# Patient Record
Sex: Male | Born: 1950
Health system: Southern US, Community
[De-identification: ages and names within clinical notes are randomized; demographics above are authoritative.]

## PROBLEM LIST (undated history)

## (undated) DIAGNOSIS — I10 Essential (primary) hypertension: Secondary | ICD-10-CM

## (undated) DIAGNOSIS — K08109 Complete loss of teeth, unspecified cause, unspecified class: Secondary | ICD-10-CM

## (undated) DIAGNOSIS — M1990 Unspecified osteoarthritis, unspecified site: Secondary | ICD-10-CM

## (undated) DIAGNOSIS — B182 Chronic viral hepatitis C: Secondary | ICD-10-CM

## (undated) DIAGNOSIS — K7460 Unspecified cirrhosis of liver: Secondary | ICD-10-CM

## (undated) DIAGNOSIS — Z972 Presence of dental prosthetic device (complete) (partial): Secondary | ICD-10-CM

## (undated) DIAGNOSIS — G4730 Sleep apnea, unspecified: Secondary | ICD-10-CM

## (undated) DIAGNOSIS — Z973 Presence of spectacles and contact lenses: Secondary | ICD-10-CM

## (undated) DIAGNOSIS — T7840XA Allergy, unspecified, initial encounter: Secondary | ICD-10-CM

## (undated) DIAGNOSIS — G4733 Obstructive sleep apnea (adult) (pediatric): Secondary | ICD-10-CM

## (undated) HISTORY — PX: ESOPHAGOGASTRODUODENOSCOPY: SHX1529

## (undated) HISTORY — DX: Allergy, unspecified, initial encounter: T78.40XA

## (undated) HISTORY — PX: COLONOSCOPY: SHX174

## (undated) HISTORY — DX: Unspecified osteoarthritis, unspecified site: M19.90

## (undated) HISTORY — DX: Sleep apnea, unspecified: G47.30

## (undated) HISTORY — DX: Unspecified cirrhosis of liver: K74.60

## (undated) HISTORY — DX: Obstructive sleep apnea (adult) (pediatric): G47.33

## (undated) HISTORY — DX: Chronic viral hepatitis C: B18.2

## (undated) HISTORY — DX: Essential (primary) hypertension: I10

---

## 1997-07-24 ENCOUNTER — Ambulatory Visit: Admission: RE | Admit: 1997-07-24 | Discharge: 1997-07-24 | Payer: Self-pay | Admitting: Otolaryngology

## 1997-09-19 ENCOUNTER — Encounter: Payer: Self-pay | Admitting: Internal Medicine

## 1997-12-27 ENCOUNTER — Encounter: Payer: Self-pay | Admitting: Gastroenterology

## 1997-12-27 ENCOUNTER — Ambulatory Visit (HOSPITAL_COMMUNITY): Admission: RE | Admit: 1997-12-27 | Discharge: 1997-12-27 | Payer: Self-pay | Admitting: Gastroenterology

## 1997-12-27 ENCOUNTER — Encounter: Payer: Self-pay | Admitting: Internal Medicine

## 1998-01-10 ENCOUNTER — Encounter: Payer: Self-pay | Admitting: Internal Medicine

## 1998-10-30 ENCOUNTER — Encounter: Payer: Self-pay | Admitting: Internal Medicine

## 2000-08-26 ENCOUNTER — Encounter: Payer: Self-pay | Admitting: Internal Medicine

## 2001-08-11 ENCOUNTER — Encounter: Payer: Self-pay | Admitting: Internal Medicine

## 2003-02-11 HISTORY — PX: THYROID LOBECTOMY: SHX420

## 2003-03-31 ENCOUNTER — Other Ambulatory Visit: Admission: RE | Admit: 2003-03-31 | Discharge: 2003-03-31 | Payer: Self-pay | Admitting: Diagnostic Radiology

## 2003-04-14 ENCOUNTER — Encounter (INDEPENDENT_AMBULATORY_CARE_PROVIDER_SITE_OTHER): Payer: Self-pay | Admitting: *Deleted

## 2003-04-14 ENCOUNTER — Ambulatory Visit (HOSPITAL_COMMUNITY): Admission: RE | Admit: 2003-04-14 | Discharge: 2003-04-14 | Payer: Self-pay | Admitting: *Deleted

## 2003-11-08 ENCOUNTER — Encounter (INDEPENDENT_AMBULATORY_CARE_PROVIDER_SITE_OTHER): Payer: Self-pay | Admitting: *Deleted

## 2003-11-08 ENCOUNTER — Ambulatory Visit (HOSPITAL_COMMUNITY): Admission: RE | Admit: 2003-11-08 | Discharge: 2003-11-09 | Payer: Self-pay | Admitting: General Surgery

## 2006-04-10 ENCOUNTER — Ambulatory Visit: Payer: Self-pay | Admitting: Gastroenterology

## 2006-10-01 ENCOUNTER — Ambulatory Visit: Payer: Self-pay | Admitting: Gastroenterology

## 2007-01-27 ENCOUNTER — Ambulatory Visit (HOSPITAL_COMMUNITY): Admission: RE | Admit: 2007-01-27 | Discharge: 2007-01-27 | Payer: Self-pay | Admitting: Gastroenterology

## 2007-11-25 ENCOUNTER — Encounter: Payer: Self-pay | Admitting: Family Medicine

## 2007-11-25 ENCOUNTER — Ambulatory Visit: Payer: Self-pay | Admitting: Gastroenterology

## 2007-12-17 ENCOUNTER — Ambulatory Visit (HOSPITAL_COMMUNITY): Admission: RE | Admit: 2007-12-17 | Discharge: 2007-12-17 | Payer: Self-pay | Admitting: Gastroenterology

## 2008-01-17 ENCOUNTER — Encounter: Payer: Self-pay | Admitting: Family Medicine

## 2008-06-22 ENCOUNTER — Encounter: Payer: Self-pay | Admitting: Family Medicine

## 2008-06-22 ENCOUNTER — Ambulatory Visit: Payer: Self-pay | Admitting: Gastroenterology

## 2008-09-11 ENCOUNTER — Encounter: Payer: Self-pay | Admitting: Family Medicine

## 2008-09-11 LAB — CONVERTED CEMR LAB
ALT: 53 units/L
AST: 50 units/L

## 2008-12-11 ENCOUNTER — Encounter: Payer: Self-pay | Admitting: Family Medicine

## 2008-12-21 ENCOUNTER — Encounter (INDEPENDENT_AMBULATORY_CARE_PROVIDER_SITE_OTHER): Payer: Self-pay | Admitting: *Deleted

## 2008-12-21 ENCOUNTER — Ambulatory Visit (HOSPITAL_COMMUNITY): Admission: RE | Admit: 2008-12-21 | Discharge: 2008-12-21 | Payer: Self-pay | Admitting: Gastroenterology

## 2008-12-28 ENCOUNTER — Ambulatory Visit: Payer: Self-pay | Admitting: Gastroenterology

## 2008-12-28 ENCOUNTER — Encounter: Payer: Self-pay | Admitting: Internal Medicine

## 2009-01-22 ENCOUNTER — Encounter (INDEPENDENT_AMBULATORY_CARE_PROVIDER_SITE_OTHER): Payer: Self-pay | Admitting: *Deleted

## 2009-02-26 ENCOUNTER — Ambulatory Visit: Payer: Self-pay | Admitting: Internal Medicine

## 2009-02-26 DIAGNOSIS — K7460 Unspecified cirrhosis of liver: Secondary | ICD-10-CM

## 2009-02-26 DIAGNOSIS — B182 Chronic viral hepatitis C: Secondary | ICD-10-CM | POA: Insufficient documentation

## 2009-03-05 ENCOUNTER — Ambulatory Visit: Payer: Self-pay | Admitting: Internal Medicine

## 2009-03-07 ENCOUNTER — Encounter: Payer: Self-pay | Admitting: Internal Medicine

## 2009-04-23 ENCOUNTER — Encounter: Payer: Self-pay | Admitting: Family Medicine

## 2009-08-28 ENCOUNTER — Ambulatory Visit: Payer: Self-pay | Admitting: Family Medicine

## 2009-08-28 DIAGNOSIS — E119 Type 2 diabetes mellitus without complications: Secondary | ICD-10-CM | POA: Insufficient documentation

## 2009-08-28 DIAGNOSIS — D696 Thrombocytopenia, unspecified: Secondary | ICD-10-CM

## 2009-08-28 DIAGNOSIS — I10 Essential (primary) hypertension: Secondary | ICD-10-CM | POA: Insufficient documentation

## 2009-08-29 LAB — CONVERTED CEMR LAB
ALT: 54 units/L — ABNORMAL HIGH (ref 0–53)
AST: 42 units/L — ABNORMAL HIGH (ref 0–37)
Albumin: 4.2 g/dL (ref 3.5–5.2)
Alkaline Phosphatase: 54 units/L (ref 39–117)
BUN: 9 mg/dL (ref 6–23)
Bilirubin, Direct: 0.2 mg/dL (ref 0.0–0.3)
CO2: 33 meq/L — ABNORMAL HIGH (ref 19–32)
Calcium: 8.5 mg/dL (ref 8.4–10.5)
Chloride: 103 meq/L (ref 96–112)
Creatinine, Ser: 0.8 mg/dL (ref 0.4–1.5)
GFR calc non Af Amer: 127.44 mL/min (ref >60)
Glucose, Bld: 125 mg/dL — ABNORMAL HIGH (ref 70–99)
Hgb A1c MFr Bld: 7.3 % — ABNORMAL HIGH (ref 4.6–6.5)
Potassium: 3.8 meq/L (ref 3.5–5.1)
Sodium: 141 meq/L (ref 135–145)
Total Bilirubin: 0.6 mg/dL (ref 0.3–1.2)
Total Protein: 7.6 g/dL (ref 6.0–8.3)

## 2009-09-19 ENCOUNTER — Encounter: Payer: Self-pay | Admitting: Family Medicine

## 2009-11-27 ENCOUNTER — Ambulatory Visit: Payer: Self-pay | Admitting: Family Medicine

## 2009-11-28 LAB — CONVERTED CEMR LAB: Hgb A1c MFr Bld: 7.8 % — ABNORMAL HIGH (ref 4.6–6.5)

## 2010-02-19 ENCOUNTER — Encounter (INDEPENDENT_AMBULATORY_CARE_PROVIDER_SITE_OTHER): Payer: Self-pay | Admitting: *Deleted

## 2010-03-12 NOTE — Procedures (Signed)
Summary: Upper Endoscopy + append re: rexcent constipation  Patient: [name] Note: All result statuses are Final unless otherwise noted.  Tests: (1) Upper Endoscopy (EGD)   EGD Upper Endoscopy       DONE     [facility]     [address]     [place], [place]  [address]           ENDOSCOPY PROCEDURE REPORT           PATIENT:  Alexander [name], Alexander [name]  MR#:  [id number]     BIRTHDATE:  01/26/1951, 58 yrs. old  GENDER:  male           ENDOSCOPIST:  Iva Boop, MD, [facility]     Referred by:  Brooke Dare, M.D.           PROCEDURE DATE:  03/05/2009     PROCEDURE:  EGD with biopsy     ASA CLASS:  Class III     INDICATIONS:  cirrhosis, Evaluate for esophageal varices in a     patient with portal hypertension and/or cirrhosis.           MEDICATIONS:   Fentanyl 50 mcg IV, Versed 7 mg     TOPICAL ANESTHETIC:  Exactacain Spray           DESCRIPTION OF PROCEDURE:   After the risks benefits and     alternatives of the procedure were thoroughly explained, informed     consent was obtained.  The [facility] [id number] [id number] endoscope was     introduced through the mouth and advanced to the second portion of     the duodenum, without limitations.  The instrument was slowly     withdrawn as the mucosa was fully examined.     <<PROCEDUREIMAGES>>           The esophagus and gastroesophageal junction were completely normal     in appearance. (No varices) Abnormal appearing mucosa in the total     stomach. Red spots and friable mucosa (mild) more prominent in     antrum. Dark, semi-adherent mucoid material an antrum that was     somewhat washable. Multiple biopsies were obtained and sent to     pathology. To determine if gastritis or portal gastropathy.  The     duodenal bulb was normal in appearance, as was the postbulbar     duodenum.    Retroflexed views revealed Retroflexion exam     demonstrated findings as previously described.    The scope was     then withdrawn from the patient and the  procedure completed.           COMPLICATIONS:  None           ENDOSCOPIC IMPRESSION:     1) Normal esophagus     2) Abnormal mucosa in the total stomach - ? gastritis vs. portal     gastropathy     3) Normal duodenum     RECOMMENDATIONS:     1) await pathology results           REPEAT EXAM:  In 1 year(s) for routine EGD to rescreen for     varices. Screening  colonoscopy due  04/2013.           Iva Boop, MD, [facility]           CC:  Brooke Dare, MD - please mail a printed copy with photos     Crawford Givens,  MD [facility])     The Patient           n.     eSIGNED:   Iva Boop at 03/05/2009 10:58 AM           Henthorn, Audry Pili, [phone]  Note: An exclamation mark (!) indicates a result that was not dispersed into the flowsheet. Document Creation Date: 03/05/2009 10:58 AM _______________________________________________________________________  (1) Order result status: Final Collection or observation date-time: 03/05/2009 10:47 Requested date-time:  Receipt date-time:  Reported date-time:  Referring Physician:   Ordering Physician: Stan Head [phone]) Specimen Source:  Source: Launa Grill Order Number: [id number] Lab site:   Appended Document: Upper Endoscopy per conversation with wife he has been having indigestion and is constipated and was advised to use MiraLax by PCP clinic patient will call me back if continues with GI symptoms and will acll bx results to him also   Appended Document: Upper Endoscopy + append re: rexcent constipation     Procedures Next Due Date:    EGD: 03/2010

## 2010-03-12 NOTE — Letter (Signed)
Summary: EGD Instructions  [facility]  [address] [place], [place] [address]   Phone: [phone]  Fax: [phone]       Alexander [name]    1950/12/21    MRN: [id number]       Procedure Day /Date:MONDAY 03/05/2009     Arrival Time: 9AM     Procedure Time:10AM     Location of Procedure:                    X  [facility] (4th Floor)    PREPARATION FOR ENDOSCOPY   On 03/05/2009  THE DAY OF THE PROCEDURE:  1.   No solid foods, milk or milk products are allowed after midnight the night before your procedure.  2.   Do not drink anything colored red or purple.  Avoid juices with pulp.  No orange juice.  3.  You may drink clear liquids until 8:00AM , which is 2 hours before your procedure.                                                                                                CLEAR LIQUIDS INCLUDE: Water Jello Ice Popsicles Tea (sugar ok, no milk/cream) Powdered fruit flavored drinks Coffee (sugar ok, no milk/cream) Gatorade Juice: apple, white grape, white cranberry  Lemonade Clear bullion, consomm, broth Carbonated beverages (any kind) Strained chicken noodle soup Hard Candy   MEDICATION INSTRUCTIONS  Unless otherwise instructed, you should take regular prescription medications with a small sip of water as early as possible the morning of your procedure.  Diabetic patients - see separate instructions.             OTHER INSTRUCTIONS  You will need a responsible adult at least 60 years of age to accompany you and drive you home.   This person must remain in the waiting room during your procedure.  Wear loose fitting clothing that is easily removed.  Leave jewelry and other valuables at home.  However, you may wish to bring a book to read or an iPod/MP3 player to listen to music as you wait for your procedure to start.  Remove all body piercing jewelry and leave at home.  Total time from sign-in until discharge is  approximately 2-3 hours.  You should go home directly after your procedure and rest.  You can resume normal activities the day after your procedure.  The day of your procedure you should not:   Drive   Make legal decisions   Operate machinery   Drink alcohol   Return to work  You will receive specific instructions about eating, activities and medications before you leave.    The above instructions have been reviewed and explained to me by   _______________________    I fully understand and can verbalize these instructions _____________________________ Date _________

## 2010-03-12 NOTE — Letter (Signed)
Summary: Re-Eval/Medical [facility]ty Services  Re-Eval/Medical Specialty Services   Imported By: Sherian Rein 03/01/2009 10:27:18  _____________________________________________________________________  External Attachment:    Type:   Image     Comment:   External Document

## 2010-03-12 NOTE — Letter (Signed)
Summary: Diabetic Instructions  [facility]  [address] [place], [place] [address]   Phone: [phone]  Fax: [phone]    Alexander [name] [birth year] MRN: [id number]   X   ORAL DIABETIC MEDICATION INSTRUCTIONS  (METFORMIN)  The day before your procedure:   Take your diabetic pill as you do normally  The day of your procedure:   Do not take your diabetic pill    We will check your blood sugar levels during the admission process and again in Recovery before discharging you home  ________________________________________________________________________  _  _   INSULIN (LONG ACTING) MEDICATION INSTRUCTIONS (Lantus, NPH, 70/30, Humulin, Novolin-N)   The day before your procedure:   Take  your regular evening dose    The day of your procedure:   Do not take your morning dose    _  _   INSULIN (SHORT ACTING) MEDICATION INSTRUCTIONS (Regular, Humulog, Novolog)   The day before your procedure:   Do not take your evening dose   The day of your procedure:   Do not take your morning dose   _  _   INSULIN PUMP MEDICATION INSTRUCTIONS  We will contact the physician managing your diabetic care for written dosage instructions for the day before your procedure and the day of your procedure.  Once we have received the instructions, we will contact you.

## 2010-03-12 NOTE — Assessment & Plan Note (Signed)
Summary: PT TO TRANSFER FROM [facility]   Vital Signs:  Patient profile:   60 year old male Height:      75 inches Weight:      264.25 pounds BMI:     33.15 Temp:     97.5 degrees F oral Pulse rate:   88 / minute Pulse rhythm:   regular BP sitting:   144 / 90  (left arm) Cuff size:   large  Vitals Entered By: Delilah Shan CMA Jameia Makris Dull) (August 28, 2009 10:52 AM) CC: Transfer from [facility]   History of Present Illness: Colonoscopy was done at 50.  EGD was done in 1/11, plan to recheck 02/2010.    Diabetes: Needs lancets and strips with 90d rx.   Using medications without difficulties: yes except for gas on acarbose Hypoglycemic episodes: no Hyperglycemic episodes:no Feet problems:no Blood Sugars averaging:  ~130-155 in AM, occ lower with exercise in AMs eye exam within last year: Sees eye MD 2 months.   due for A1c today.  Hypertension:      Using medication without problems or lightheadedness: yes Chest pain with exertion:no Edema: occ if prolong standing Short of breath:no Average home BPs: 125-130s/80s Other issues: no  Followed  by Dr. Jacqualine Mau with medical specialty clinic for HCV.    Current Medications (verified): 1)  Metformin Hcl 1000 Mg Tabs (Metformin Hcl) .... Take 1 Tablet By Mouth Two Times A Day 2)  Acarbose 25 Mg Tabs (Acarbose) .... Take 2 Tablets Three Times A Day 3)  Lisinopril 40 Mg Tabs (Lisinopril) .... Take 1 Tablet By Mouth Once A Day 4)  Metoprolol Succinate 100 Mg Xr24h-Tab (Metoprolol Succinate) .... Take 1-1/2  Tablets  By Mouth Once A Day 5)  Multivitamins  Tabs (Multiple Vitamin) .... Take 1 Tablet By Mouth Once A Day 6)  Diovan Hct 320-25 Mg Tabs (Valsartan-Hydrochlorothiazide) .... Take 1 Tablet By Mouth Once A Day 7)  Tums Ultra 1000 1000 Mg Chew (Calcium Carbonate Antacid) .... Chew One Daily 8)  [facility] Aspirin 81 Mg Tbec (Aspirin) .... Take 1 Tablet By Mouth Once A Day 9)  Amlodipine Besylate 10 Mg Tabs (Amlodipine Besylate) .... Take 1  Tablet By Mouth Once Daily 10)  Aspirin 81 Mg  Tabs (Aspirin) .... Take 1 Tablet By Mouth Once A Day  Allergies: No Known Drug Allergies  Past History:  Past Medical History: TB SKIN TEST, POSITIVE (ICD-795.5) HEPATITIS C, CHRONIC (ICD-070.54) s/p HBV and HAV vaccines, genotype 1, nonresponder to IFN/ribavirin HEPATIC CIRRHOSIS (ICD-571.5) SCREENING, COLON CANCER (ICD-V76.51) Colonoscopy was done at 50.  EGD was done in 1/11, plan to recheck 02/2010.   Diabetes mellitus, type II Hypertension  Past Surgical History: Rt Thyroid Lobectomy - 2005  Social History: Reviewed history from 02/26/2009 and no changes required. Occupation: Retired Married 3 childern Patient is a former smoker.  Alcohol Use - no Daily Caffeine Use: one daily  Illicit Drug Use - no Patient does not get regular exercise.  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    Review of Systems       See HPI.  Otherwise noncontributory.    Physical Exam  General:  GEN: nad, alert and oriented HEENT: mucous membranes moist NECK: supple w/o LA CV: rrr.  no murmur PULM: ctab, no inc wob ABD: soft, +bs EXT: no edema SKIN: no acute rash   Diabetes Management Exam:    Foot Exam (with socks and/or shoes not present):       Sensory-Pinprick/Light touch:  Left medial foot (L-4): normal          Left dorsal foot (L-5): normal          Left lateral foot (S-1): normal          Right medial foot (L-4): normal          Right dorsal foot (L-5): normal          Right lateral foot (S-1): normal       Sensory-Monofilament:          Left foot: normal          Right foot: normal       Inspection:          Left foot: normal          Right foot: normal       Nails:          Left foot: normal          Right foot: normal    Eye Exam:       Eye Exam done elsewhere   Impression & Recommendations:  Problem # 1:  HYPERTENSION, BENIGN (ICD-401.1) No change in meds.  Contact with labs.  The following medications  were removed from the medication list:    Amlodipine Besylate 5 Mg Tabs (Amlodipine besylate) .Marland Kitchen... Take 1 tablet by mouth two times a day His updated medication list for this problem includes:    Lisinopril 40 Mg Tabs (Lisinopril) .Marland Kitchen... Take 1 tablet by mouth once a day    Metoprolol Succinate 100 Mg Xr24h-tab (Metoprolol succinate) .Marland Kitchen... Take 1-1/2  tablets  by mouth once a day    Diovan Hct 320-25 Mg Tabs (Valsartan-hydrochlorothiazide) .Marland Kitchen... Take 1 tablet by mouth once a day    Amlodipine Besylate 10 Mg Tabs (Amlodipine besylate) .Marland Kitchen... Take 1 tablet by mouth once daily  Problem # 2:  DIABETES MELLITUS (ICD-250.00) Contact with labs.  Increase exercise.  Plan for follow up in 3 months with physical within next  ~6 months.  His updated medication list for this problem includes:    Metformin Hcl 1000 Mg Tabs (Metformin hcl) .Marland Kitchen... Take 1 tablet by mouth two times a day    Acarbose 25 Mg Tabs (Acarbose) .Marland Kitchen... Take 2 tablets three times a day    Lisinopril 40 Mg Tabs (Lisinopril) .Marland Kitchen... Take 1 tablet by mouth once a day    Diovan Hct 320-25 Mg Tabs (Valsartan-hydrochlorothiazide) .Marland Kitchen... Take 1 tablet by mouth once a day    [facility] Joseph Aspirin 81 Mg Tbec (Aspirin) .Marland Kitchen... Take 1 tablet by mouth once a day    Aspirin 81 Mg Tabs (Aspirin) .Marland Kitchen... Take 1 tablet by mouth once a day  Orders: TLB-A1C / Hgb A1C (Glycohemoglobin) (83036-A1C) TLB-Hepatic/Liver Function Pnl (80076-HEPATIC) TLB-BMP (Basic Metabolic Panel-BMET) (80048-METABOL)  Complete Medication List: 1)  Metformin Hcl 1000 Mg Tabs (Metformin hcl) .... Take 1 tablet by mouth two times a day 2)  Acarbose 25 Mg Tabs (Acarbose) .... Take 2 tablets three times a day 3)  Lisinopril 40 Mg Tabs (Lisinopril) .... Take 1 tablet by mouth once a day 4)  Metoprolol Succinate 100 Mg Xr24h-tab (Metoprolol succinate) .... Take 1-1/2  tablets  by mouth once a day 5)  Multivitamins Tabs (Multiple vitamin) .... Take 1 tablet by mouth once a day 6)  Diovan Hct  320-25 Mg Tabs (Valsartan-hydrochlorothiazide) .... Take 1 tablet by mouth once a day 7)  Tums Ultra 1000 1000 Mg Chew (Calcium carbonate antacid) .... Chew one  daily 8)  [facility] Joseph Aspirin 81 Mg Tbec (Aspirin) .... Take 1 tablet by mouth once a day 9)  Amlodipine Besylate 10 Mg Tabs (Amlodipine besylate) .... Take 1 tablet by mouth once daily 10)  Aspirin 81 Mg Tabs (Aspirin) .... Take 1 tablet by mouth once a day  Patient Instructions: 1)  Please schedule a follow-up appointment in 3 months.  We'll contact you with your lab report. Take care.  Try to increase your walking and exercise.   Current Allergies (reviewed today): No known allergies

## 2010-03-12 NOTE — Letter (Signed)
Summary: Medical Specialty Services  Medical Specialty Services   Imported By: Lanelle Bal 09/21/2009 09:29:27  _____________________________________________________________________  External Attachment:    Type:   Image     Comment:   External Document

## 2010-03-12 NOTE — Assessment & Plan Note (Signed)
Summary: SCREENING EGD/VARICES...AS.   History of Present Illness Visit Type: consult  Primary GI MD: Stan Head MD [facility] Primary Provider: Crawford Givens, MD  Requesting Provider: Brooke Dare, MD Chief Complaint: cirrhosis History of Present Illness:   no problems has HCV and cirrhosis and is being seen to schedule EGD to screen for esophageal varices   GI Review of Systems      Denies abdominal pain, acid reflux, belching, bloating, chest pain, dysphagia with liquids, dysphagia with solids, heartburn, loss of appetite, nausea, vomiting, vomiting blood, weight loss, and  weight gain.      Reports liver problems.     Denies anal fissure, black tarry stools, change in bowel habit, constipation, diarrhea, diverticulosis, fecal incontinence, heme positive stool, hemorrhoids, irritable bowel syndrome, jaundice, light color stool, rectal bleeding, and  rectal pain.    Current Medications (verified): 1)  Metformin Hcl 1000 Mg Tabs (Metformin Hcl) .... Take 1 Tablet By Mouth Two Times A Day 2)  Acarbose 25 Mg Tabs (Acarbose) .... Take 2 Tablets Three Times A Day 3)  Lisinopril 40 Mg Tabs (Lisinopril) .... Take 1 Tablet By Mouth Once A Day 4)  Metoprolol Succinate 100 Mg Xr24h-Tab (Metoprolol Succinate) .... Take 1 Tablet By Mouth Once A Day 5)  Amlodipine Besylate 5 Mg Tabs (Amlodipine Besylate) .... Take 1 Tablet By Mouth Two Times A Day 6)  Multivitamins  Tabs (Multiple Vitamin) .... Take 1 Tablet By Mouth Once A Day 7)  Diovan Hct 320-25 Mg Tabs (Valsartan-Hydrochlorothiazide) .... Take 1 Tablet By Mouth Once A Day 8)  Tums Ultra 1000 1000 Mg Chew (Calcium Carbonate Antacid) .... Chew One Daily 9)  [facility] Aspirin 81 Mg Tbec (Aspirin) .... Take 1 Tablet By Mouth Once A Day  Allergies (verified): No Known Drug Allergies  Past History:  Past Medical History: Thrombocytopenia - mild Cirrhosis Diabetes Hepatitis C- genotype 1, nonresponder to  IFN/ribavirin Hypertension Obesity s/p HBV and HAV vaccines  Past Surgical History: Reviewed history from 02/22/2009 and no changes required. Rt Thyroid Lobectomy  Family History: No FH of Colon Cancer: Family History of Stomach Cancer:Father  Family History of Diabetes: Maternal Side   Social History: Occupation: Retired Married 3 childern Patient is a former smoker.  Alcohol Use - no Daily Caffeine Use: one daily  Illicit Drug Use - no Patient does not get regular exercise.  Smoking Status:  quit Drug Use:  no Does Patient Exercise:  no  Review of Systems       The patient complains of allergy/sinus and sore throat.         All other ROS negative except as per HPI.    Vital Signs:  Patient profile:   60 year old male Height:      72 inches Weight:      262 pounds BMI:     35.66 BSA:     2.39 Pulse rate:   76 / minute Pulse rhythm:   regular BP sitting:   132 / 84  (left arm) Cuff size:   regular  Vitals Entered By: Ok Anis CMA (February 26, 2009 10:54 AM)  Physical Exam  General:  overweight NAD Eyes:  anicteric Mouth:  No deformity or lesions, dentition normal. Neck:  Supple; no masses or thyromegaly. Lungs:  Clear throughout to auscultation. Heart:  Regular rate and rhythm; no murmurs, rubs,  or bruits. Abdomen:  soft, non-tender without HSM or mass Extremities:  no lower edema Neurologic:  Alert and  oriented x3 Psych:  Alert and cooperative. Normal mood and affect.   Impression & Recommendations:  Problem # 1:  HEPATIC CIRRHOSIS (ICD-571.5) Assessment Comment Only New to me. Risks, benefits,and indications of endoscopic procedure(s) were reviewed with the patient and all questions answered. EGD to screen for varices will cc: PCP and [facility] when done  Orders: EGD (EGD)  Problem # 2:  HEPATITIS C, CHRONIC (ICD-070.54) Assessment: Unchanged genotype 1 s/p IFN ribavirin - did not respond IL28 polymorphism genotype CT  Problem # 3:   SCREENING, COLON CANCER (ICD-V76.51) had colonoscopy 2005 - normal [facility] ([place]) would be due 2015  Patient Instructions: 1)  Your EGD is scheduled for 03/05/2009 at 10am 2)  We have given you diabetic instructions 3)  Upper Endoscopy brochure given.  4)  The medication list was reviewed and reconciled.  All changed / newly prescribed medications were explained.  A complete medication list was provided to the patient / caregiver. cc: [facility]

## 2010-03-12 NOTE — Assessment & Plan Note (Signed)
Summary: FOLLOW UP / LFW   Vital Signs:  Patient profile:   60 year old male Height:      75 inches Weight:      266.25 pounds BMI:     33.40 Temp:     97.7 degrees F oral Pulse rate:   88 / minute Pulse rhythm:   regular BP sitting:   142 / 110  (left arm) Cuff size:   large  Vitals Entered By: Delilah Shan CMA Duncan Dull) (November 27, 2009 8:13 AM) CC: 3 months follow up   History of Present Illness: Flu shot done at work.    Diabetes: running higher than before Using medications without difficulties:yes, but some gas with acarbose (sometimes cuting back to bid instead of tid). Hypoglycemic episodes:no Hyperglycemic episodes:no Feet problems:no Blood Sugars averaging: 140-160 in AM, depending on PM foods the night before eye exam within last year: pending per patient- he is going to work on this.  Call at the hospital has changed his schedule.   Exercise:  Walking some at work- we have discussed this before.  He is trying to exercise more with his wife at home.  Use the stairs more at work.    Due to see medical specialty clinic in 11/11 and repeat EGD in 1/12.    Allergies: No Known Drug Allergies  Review of Systems       See HPI.  Otherwise negative.    Physical Exam  General:  GEN: nad, alert and oriented HEENT: mucous membranes moist NECK: supple w/o LA CV: rrr.  no murmur PULM: ctab, no inc wob ABD: soft, +bs EXT: no edema SKIN: no acute rash   Diabetes Management Exam:    Foot Exam (with socks and/or shoes not present):       Sensory-Pinprick/Light touch:          Left medial foot (L-4): normal          Left dorsal foot (L-5): normal          Left lateral foot (S-1): normal          Right medial foot (L-4): normal          Right dorsal foot (L-5): normal          Right lateral foot (S-1): normal       Sensory-Monofilament:          Left foot: normal          Right foot: normal       Inspection:          Left foot: normal          Right foot:  normal       Nails:          Left foot: normal          Right foot: normal   Impression & Recommendations:  Problem # 1:  DIABETES MELLITUS (ICD-250.00) No change in meds for now.  See notes on A1c result.  Inc exercise in meantime.  Plan on CPE in early 2012. His updated medication list for this problem includes:    Metformin Hcl 1000 Mg Tabs (Metformin hcl) .Marland Kitchen... Take 1 tablet by mouth two times a day    Acarbose 25 Mg Tabs (Acarbose) .Marland Kitchen... Take 2 tablets three times a day    Lisinopril 40 Mg Tabs (Lisinopril) .Marland Kitchen... Take 1 tablet by mouth once a day    Diovan Hct 320-25 Mg Tabs (Valsartan-hydrochlorothiazide) .Marland Kitchen... Take 1 tablet by  mouth once a day    [facility] Aspirin 81 Mg Tbec (Aspirin) .Marland Kitchen... Take 1 tablet by mouth once a day    Aspirin 81 Mg Tabs (Aspirin) .Marland Kitchen... Take 1 tablet by mouth once a day  His updated medication list for this problem includes:    Metformin Hcl 1000 Mg Tabs (Metformin hcl) .Marland Kitchen... Take 1 tablet by mouth two times a day    Acarbose 25 Mg Tabs (Acarbose) .Marland Kitchen... Take 2 tablets three times a day    Lisinopril 40 Mg Tabs (Lisinopril) .Marland Kitchen... Take 1 tablet by mouth once a day    Diovan Hct 320-25 Mg Tabs (Valsartan-hydrochlorothiazide) .Marland Kitchen... Take 1 tablet by mouth once a day    [facility] Aspirin 81 Mg Tbec (Aspirin) .Marland Kitchen... Take 1 tablet by mouth once a day    Aspirin 81 Mg Tabs (Aspirin) .Marland Kitchen... Take 1 tablet by mouth once a day  Complete Medication List: 1)  Metformin Hcl 1000 Mg Tabs (Metformin hcl) .... Take 1 tablet by mouth two times a day 2)  Acarbose 25 Mg Tabs (Acarbose) .... Take 2 tablets three times a day 3)  Lisinopril 40 Mg Tabs (Lisinopril) .... Take 1 tablet by mouth once a day 4)  Metoprolol Succinate 100 Mg Xr24h-tab (Metoprolol succinate) .... Take 1-1/2  tablets  by mouth once a day 5)  Multivitamins Tabs (Multiple vitamin) .... Take 1 tablet by mouth once a day 6)  Diovan Hct 320-25 Mg Tabs (Valsartan-hydrochlorothiazide) .... Take 1 tablet by mouth  once a day 7)  Tums Ultra 1000 1000 Mg Chew (Calcium carbonate antacid) .... Chew one daily 8)  [facility] Aspirin 81 Mg Tbec (Aspirin) .... Take 1 tablet by mouth once a day 9)  Amlodipine Besylate 10 Mg Tabs (Amlodipine besylate) .... Take 1 tablet by mouth once daily 10)  Aspirin 81 Mg Tabs (Aspirin) .... Take 1 tablet by mouth once a day  Other Orders: TLB-A1C / Hgb A1C (Glycohemoglobin) (83036-A1C)  Patient Instructions: 1)  You can get your results through our phone system.  Follow the instructions on the blue card.  2)  I would get a physical in 2/12.  I would get your fasting labs done ahead of time. 3)  cmet/lipid/A1c 250.00 4)  PSA 4.76.44 5)  Take care.  Glad to see you.  Try to walk more at home.    Orders Added: 1)  Est. Patient Level III [[address]] 2)  TLB-A1C / Hgb A1C (Glycohemoglobin) [83036-A1C]   Immunization History:  Influenza Immunization History:    Influenza:  historical (11/19/2009)   Immunization History:  Influenza Immunization History:    Influenza:  Historical (11/19/2009)  Current Allergies (reviewed today): No known allergies

## 2010-03-12 NOTE — Procedures (Signed)
Summary: Colonoscopy: Dr. Luther Parody: Normal   Colonoscopy  Procedure date:  04/14/2003  Findings:      Results: Normal. Location:  [facility].    Procedures Next Due Date:    Colonoscopy: 04/2013  Patient Name: Alexander [name], Alexander [name] MRN: [id number] Procedure Procedures: Colonoscopy CPT: 54098.  Personnel: Endoscopist: Roosvelt Harps, MD.  Referred By: Henrine Screws, MD.  Exam Location: Exam performed in Endoscopy Suite. Outpatient  Patient Consent: Procedure, Alternatives, Risks and Benefits discussed, consent obtained, from patient. Consent was obtained by the RN. Consent to be contacted was not given.  Indications  Average Risk Screening Routine.  History  Current Medications: Patient is not currently taking Coumadin.  Allergies: No known allergies.  Patient Habits Patient does not smoke. Drinking Status: not currently drinking.  Pre-Exam Physical: Cardio-pulmonary exam, Rectal exam, HEENT exam , Abdominal exam, Extremity exam, Neurological exam, Mental status exam WNL.  Exam Exam: Extent of exam reached: Cecum, extent intended: Cecum.  The cecum was identified by appendiceal orifice and IC valve. Patient position: on left side. Colon retroflexion performed. Images taken. ASA Classification: II. Tolerance: excellent.  Monitoring: Pulse and BP monitoring, Oximetry used. Supplemental O2 given.  Colon Prep Used Phospho Soda for colon prep. Prep results: excellent.  Fluoroscopy: Fluoroscopy was not used.  Sedation Meds: Patient assessed and found to be appropriate for moderate (conscious) sedation. Sedation was managed by the Endoscopist. Demerol 100 mg. given IV. Versed 9 mg. given IV.  Findings IMAGE TAKEN: Ascending Colon.  Image #2 attached.  Comments:  Normal.  IMAGE TAKEN: Cecum.  Image #1 attached.  Comments:  Normal.  IMAGE TAKEN: Sigmoid Colon.  Image #3 attached.  Comments:  Normal.  IMAGE TAKEN: Rectum.  Image #4 attached.  Comments:  Normal.    Assessment Normal examination.  Events  Unplanned Interventions: No intervention was required.  Unplanned Events: There were no complications. Plans  Post Exam Instructions: Post sedation instructions given.  Medication Plan: Continue current medications.  Patient Education: Patient given standard instructions for: Patient instructed to get routine colonoscopy every 10 years.  Disposition: After procedure patient sent to recovery. After recovery patient sent home.  Scheduling/Referral: Colonoscopy, to Roosvelt Harps, MD, around Apr 13, 2013.   CC:   Henrine Screws, MD  This report was created from the original endoscopy report, which was reviewed and signed by the above listed endoscopist.

## 2010-03-12 NOTE — Letter (Signed)
Summary: Medical Specialty Services  Medical Specialty Services   Imported By: Lanelle Bal 09/21/2009 09:26:23  _____________________________________________________________________  External Attachment:    Type:   Image     Comment:   External Document

## 2010-03-12 NOTE — Letter (Signed)
Summary: Patient [name] Biopsy Results  [facility]  [address] [place], [place] [address]   Phone: [phone]  Fax: [phone]        March 07, 2009 MRN: [id number]    [facility] [name] [name], [place]  [address]    Dear Alexander [name],  I am pleased to inform you that the biopsies taken during your recent endoscopic examination did not show any abnormality. The appearance of the stomach mucosa or lining suggested possible inflammation or changes from your liver disease but the biopsies did not reveal any abnormality. This is good news.  You should have a repeat endoscopic examination to look for the development of esophageal varices in 1 year.  Please call us if you are having persistent problems or have questions about your condition that have not been fully answered at this time.  Sincerely,  Iva Boop MD, [facility]  This letter has been electronically signed by your physician.  Appended Document: Patient Notice-Endo Biopsy Results Letter mailed 1.27.11

## 2010-03-13 ENCOUNTER — Encounter (INDEPENDENT_AMBULATORY_CARE_PROVIDER_SITE_OTHER): Payer: Self-pay | Admitting: *Deleted

## 2010-03-14 ENCOUNTER — Ambulatory Visit: Admit: 2010-03-14 | Payer: Self-pay | Admitting: Internal Medicine

## 2010-03-14 ENCOUNTER — Encounter: Payer: Self-pay | Admitting: Internal Medicine

## 2010-03-14 NOTE — Letter (Signed)
Summary: Pre Visit Letter Revised  [facility]  [address] [place], [place] [address]   Phone: [phone]  Fax: [phone]        02/19/2010 MRN: [id number] [facility] [address] [place], [place]  [address]             Procedure Date:  03-27-10   Welcome to the Gastroenterology Division at [facility]    You are scheduled to see a nurse for your pre-procedure visit on 03-14-10 at 2:00p.m. on the 3rd floor at [facility], [address]  We ask that you try to arrive at our office 15 minutes prior to your appointment time to allow for check-in.  Please take a minute to review the attached form.  If you answer "Yes" to one or more of the questions on the first page, we ask that you call the person listed at your earliest opportunity.  If you answer "No" to all of the questions, please complete the rest of the form and bring it to your appointment.    Your nurse visit will consist of discussing your medical and surgical history, your immediate family medical history, and your medications.   If you are unable to list all of your medications on the form, please bring the medication bottles to your appointment and we will list them.  We will need to be aware of both prescribed and over the counter drugs.  We will need to know exact dosage information as well.    Please be prepared to read and sign documents such as consent forms, a financial agreement, and acknowledgement forms.  If necessary, and with your consent, a friend or relative is welcome to sit-in on the nurse visit with you.  Please bring your insurance card so that we may make a copy of it.  If your insurance requires a referral to see a specialist, please bring your referral form from your primary care physician.  No co-pay is required for this nurse visit.     If you cannot keep your appointment, please call [phone] to cancel or reschedule prior to your appointment date.  This allows [place] the  opportunity to schedule an appointment for another patient in need of care.    Thank you for choosing [facility] for your medical needs.  We appreciate the opportunity to care for you.  Please visit [place] at our website  to learn more about our practice.  Sincerely, The Gastroenterology Division

## 2010-03-15 ENCOUNTER — Encounter: Payer: Self-pay | Admitting: Internal Medicine

## 2010-03-20 NOTE — Miscellaneous (Signed)
Summary: [facility] previsit  Clinical Lists Changes  Observations: Added new observation of NKA: T (03/14/2010 14:19)

## 2010-03-20 NOTE — Letter (Signed)
Summary: EGD Instructions  [name]  [address] [place], [place] [address]   Phone: [phone]  Fax: [phone]       Alexander [name]    1950/12/31    MRN: [id number]       Procedure Day [name]:  Wednesday 03/27/2010     Arrival Time: 9:00 am     Procedure Time: 10:00 am     Location of Procedure:                    _x  _  Endoscopy Center (4th Floor)    PREPARATION FOR ENDOSCOPY   On Wednesday 2/15 THE DAY OF THE PROCEDURE:  1.   No solid foods, milk or milk products are allowed after midnight the night before your procedure.  2.   Do not drink anything colored red or purple.  Avoid juices with pulp.  No orange juice.  3.  You may drink clear liquids until 8:00 am, which is 2 hours before your procedure.                                                                                                CLEAR LIQUIDS INCLUDE: Water Jello Ice Popsicles Tea (sugar ok, no milk/cream) Powdered fruit flavored drinks Coffee (sugar ok, no milk/cream) Gatorade Juice: apple, white grape, white cranberry  Lemonade Clear bullion, consomm, broth Carbonated beverages (any kind) Strained chicken noodle soup Hard Candy   MEDICATION INSTRUCTIONS  Unless otherwise instructed, you should take regular prescription medications with a small sip of water as early as possible the morning of your procedure.  Diabetic patients - see separate instructions.             OTHER INSTRUCTIONS  You will need a responsible adult at least 60 years of age to accompany you and drive you home.   This person must remain in the waiting room during your procedure.  Wear loose fitting clothing that is easily removed.  Leave jewelry and other valuables at home.  However, you may wish to bring a book to read or an iPod/MP3 player to listen to music as you wait for your procedure to start.  Remove all body piercing jewelry and leave at home.  Total time from sign-in  until discharge is approximately 2-3 hours.  You should go home directly after your procedure and rest.  You can resume normal activities the day after your procedure.  The day of your procedure you should not:   Drive   Make legal decisions   Operate machinery   Drink alcohol   Return to work  You will receive specific instructions about eating, activities and medications before you leave.    The above instructions have been reviewed and explained to me by   Karl Bales RN  March 14, 2010 2:19 PM    I fully understand and can verbalize these instructions _____________________________ Date _________

## 2010-03-20 NOTE — Letter (Signed)
Summary: Diabetic Instructions  [facility]  [address] [place], [place] [address]   Phone: [phone]  Fax: [phone]    Alexander [name] 09-Feb-1951 MRN: [id number]   x    ORAL DIABETIC MEDICATION INSTRUCTIONS  The day before your procedure:   Take your diabetic pill as you do normally  The day of your procedure:   Do not take your diabetic pill    We will check your blood sugar levels during the admission process and again in Recovery before discharging you home  ________________________________________________________________________

## 2010-03-26 ENCOUNTER — Encounter (INDEPENDENT_AMBULATORY_CARE_PROVIDER_SITE_OTHER): Payer: Self-pay | Admitting: *Deleted

## 2010-03-26 ENCOUNTER — Other Ambulatory Visit (INDEPENDENT_AMBULATORY_CARE_PROVIDER_SITE_OTHER): Payer: BC Managed Care – PPO

## 2010-03-26 ENCOUNTER — Other Ambulatory Visit: Payer: Self-pay | Admitting: Family Medicine

## 2010-03-26 DIAGNOSIS — I10 Essential (primary) hypertension: Secondary | ICD-10-CM

## 2010-03-26 DIAGNOSIS — E119 Type 2 diabetes mellitus without complications: Secondary | ICD-10-CM

## 2010-03-26 DIAGNOSIS — Z125 Encounter for screening for malignant neoplasm of prostate: Secondary | ICD-10-CM

## 2010-03-26 LAB — BASIC METABOLIC PANEL
BUN: 8 mg/dL (ref 6–23)
Calcium: 8.9 mg/dL (ref 8.4–10.5)
GFR: 115.46 mL/min (ref >60.00)
Glucose, Bld: 177 mg/dL — ABNORMAL HIGH (ref 70–99)
Potassium: 3.8 mEq/L (ref 3.5–5.1)

## 2010-03-26 LAB — LIPID PANEL
Cholesterol: 174 mg/dL (ref 0–200)
Total CHOL/HDL Ratio: 6
Triglycerides: 104 mg/dL (ref 0.0–149.0)

## 2010-03-26 LAB — HEPATIC FUNCTION PANEL
AST: 44 U/L — ABNORMAL HIGH (ref 0–37)
Albumin: 3.8 g/dL (ref 3.5–5.2)

## 2010-03-26 LAB — HEMOGLOBIN A1C: Hgb A1c MFr Bld: 8 % — ABNORMAL HIGH (ref 4.6–6.5)

## 2010-03-27 ENCOUNTER — Other Ambulatory Visit: Payer: Self-pay | Admitting: Internal Medicine

## 2010-03-27 ENCOUNTER — Other Ambulatory Visit (AMBULATORY_SURGERY_CENTER): Payer: BC Managed Care – PPO | Admitting: Internal Medicine

## 2010-03-27 DIAGNOSIS — K7460 Unspecified cirrhosis of liver: Secondary | ICD-10-CM

## 2010-03-27 DIAGNOSIS — K2970 Gastritis, unspecified, without bleeding: Secondary | ICD-10-CM

## 2010-03-27 LAB — GLUCOSE, CAPILLARY: Glucose-Capillary: 169 mg/dL — ABNORMAL HIGH (ref 70–99)

## 2010-03-28 NOTE — Letter (Signed)
Summary: [facility] Clinic Note/[facility]  [facility] Clinic Note/[facility]   Imported By: Sherian Rein 03/19/2010 06:45:16  _____________________________________________________________________  External Attachment:    Type:   Image     Comment:   External Document

## 2010-03-28 NOTE — Progress Notes (Signed)
Summary: [facility]   Imported By: Sherian Rein 03/19/2010 06:47:41  _____________________________________________________________________  External Attachment:    Type:   Image     Comment:   External Document

## 2010-03-28 NOTE — Progress Notes (Signed)
Summary: [facility]   Imported By: Sherian Rein 03/19/2010 06:46:43  _____________________________________________________________________  External Attachment:    Type:   Image     Comment:   External Document

## 2010-03-28 NOTE — Letter (Signed)
Summary: [facility] Clinic Note/[facility]  [facility] Note/[facility]   Imported By: Sherian Rein 03/19/2010 06:44:24  _____________________________________________________________________  External Attachment:    Type:   Image     Comment:   External Document

## 2010-04-02 ENCOUNTER — Encounter: Payer: Self-pay | Admitting: Family Medicine

## 2010-04-02 ENCOUNTER — Encounter (INDEPENDENT_AMBULATORY_CARE_PROVIDER_SITE_OTHER): Payer: BC Managed Care – PPO | Admitting: Family Medicine

## 2010-04-02 DIAGNOSIS — Z23 Encounter for immunization: Secondary | ICD-10-CM

## 2010-04-02 DIAGNOSIS — Z1211 Encounter for screening for malignant neoplasm of colon: Secondary | ICD-10-CM

## 2010-04-02 DIAGNOSIS — E119 Type 2 diabetes mellitus without complications: Secondary | ICD-10-CM

## 2010-04-03 NOTE — Procedures (Signed)
Summary: Upper Endoscopy  Patient: Alexander [name] Note: All result statuses are Final unless otherwise noted.  Tests: (1) Upper Endoscopy (EGD)   EGD Upper Endoscopy       DONE     [facility]     [address]     [place], [place]  [address]           ENDOSCOPY PROCEDURE REPORT           PATIENT:  Ralph, Brouwer  MR#:  [id number]     BIRTHDATE:  23-Aug-1950, 59 yrs. old  GENDER:  male           ENDOSCOPIST:  Iva Boop, MD, [facility]           PROCEDURE DATE:  03/27/2010     PROCEDURE:  Esophagoscopy     ASA CLASS:  Class III     INDICATIONS:  Evaluate for esophageal varices in a patient with     portal hypertension and/or cirrhosis.           MEDICATIONS:   Fentanyl 50 mcg IV, Versed 6 mg IV     TOPICAL ANESTHETIC:  Exactacain Spray           DESCRIPTION OF PROCEDURE:   After the risks benefits and     alternatives of the procedure were thoroughly explained, informed     consent was obtained.  [facility] [id number] endoscope was     introduced through the mouth and advanced to the pylorus, without     limitations.  The instrument was slowly withdrawn as the mucosa     was fully examined.     <<PROCEDUREIMAGES>>           Mild gastritis was found in the body and the antrum of the     stomach. Erythema and red spots seen.  Otherwise the examination     was normal. No varices identified.    Retroflexed views revealed     no abnormalities.    The scope was then withdrawn from the patient     and the procedure completed.           COMPLICATIONS:  None           ENDOSCOPIC IMPRESSION:     1) Mild gastritis suspected (biopsies last year showed benign     gastric mucosa)     2) Otherwise normal examination - no varices identified           REPEAT EXAM:  In 2 year(s) for EGD.           Iva Boop, MD, [facility]           CC:  Brooke Dare, MD     The Patient           n.     eSIGNED:   Iva Boop at 03/27/2010 11:01 AM           Elroy Channel, Audry Pili,  [id number]  Note: An exclamation mark (!) indicates a result that was not dispersed into the flowsheet. Document Creation Date: 03/27/2010 11:01 AM _______________________________________________________________________  (1) Order result status: Final Collection or observation date-time: 03/27/2010 10:54 Requested date-time:  Receipt date-time:  Reported date-time:  Referring Physician:   Ordering Physician: Stan Head [phone]) Specimen Source:  Source: Launa Grill Order Number: [id number] Lab site:   Appended Document: Upper Endoscopy    Clinical Lists Changes  Observations: Added new observation of COLONNXTDUE: 03/2012 (03/27/2010 16:51)

## 2010-04-09 ENCOUNTER — Encounter: Payer: Self-pay | Admitting: Family Medicine

## 2010-04-09 NOTE — Letter (Signed)
Summary: [facility]: Immunoassay Fecal Occult Blood (iFOB) Order Form  [facility]  [address] [place], [place] [address]   Phone: [phone]  Fax: [phone]      [facility]: Immunoassay Fecal Occult Blood (iFOB) Order Form   April 02, 2010 MRN: [id number]   SIERRA Gruhn 05/05/1950   Physicican Name:_______duncan__________________  Diagnosis Code:________v76.49__________________      Crawford Givens MD

## 2010-04-09 NOTE — Assessment & Plan Note (Signed)
Summary: CPX/RBH   Vital Signs:  Patient profile:   60 year old [name] Height:      75 inches Weight:      271.25 pounds BMI:     34.03 Temp:     97.8 degrees F oral Pulse rate:   64 / minute Pulse rhythm:   regular BP sitting:   134 / 88  (left arm) Cuff size:   large  Vitals Entered By: Delilah Shan CMA Aveion Nguyen Dull) (April 02, 2010 8:43 AM) CC: CPX   History of Present Illness: CPE: this was tabled due to DM2 changes/discussion.    Tdap and IFOB d/w pt today.  Diabetes:  Using medications without difficulties:yes Hypoglycemic episodes:no Hyperglycemic episodes:yes Feet problems:no Blood Sugars averaging: 170 in AM eye exam within last year: yes- 1 month ago.   We talked about his diet and exercise.  Both are influenced  by work schedule.    Allergies: No Known Drug Allergies  Past History:  Past Medical History: Last updated: 09/19/2009 TB SKIN TEST, POSITIVE (ICD-795.5) HEPATITIS C, CHRONIC (ICD-070.54) s/p HBV and HAV vaccines, genotype 1, nonresponder to IFN/ribavirin HEPATIC CIRRHOSIS (ICD-571.5) SCREENING, COLON CANCER (ICD-V76.51) Colonoscopy was done at 50.  EGD was done in 1/11, plan to recheck 02/2010, no varices; portal gastropathy vs. gastritis, duodenum wnl.   Diabetes mellitus, type II Hypertension  Social History: Reviewed history from 08/28/2009 and no changes required. Occupation: Retired Married 3 childern Patient is a former smoker.  Alcohol Use - no Daily Caffeine Use: one daily  Illicit Drug Use - no Patient does not get regular exercise.  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    Review of Systems       See HPI.  Otherwise negative.    Physical Exam  General:  GEN: nad, alert and oriented HEENT: mucous membranes moist NECK: supple w/o LA, scar noted CV: rrr.  no murmur PULM: ctab, no inc wob ABD: soft, +bs EXT: no edema SKIN: no acute rash   Diabetes Management Exam:    Foot Exam (with socks and/or shoes not present):   Sensory-Pinprick/Light touch:          Left medial foot (L-4): normal          Left dorsal foot (L-5): normal          Left lateral foot (S-1): normal          Right medial foot (L-4): normal          Right dorsal foot (L-5): normal          Right lateral foot (S-1): normal       Sensory-Monofilament:          Left foot: normal          Right foot: normal       Inspection:          Left foot: normal          Right foot: normal       Nails:          Left foot: normal          Right foot: normal   Impression & Recommendations:  Problem # 1:  DIABETES MELLITUS, TYPE II (ICD-250.00) >25 min spent with patient, at least half of which was spent on counseling.  refer for DM2 education to help with diet and exercise.  Recheck in 3 months.  He may need insulin at that point if not improved.  I talked with patient about this.  The following medications were removed from the medication list:    [facility]s Aspirin 81 Mg Tbec (Aspirin) .Marland Kitchen... Take 1 tablet by mouth once a day His updated medication list for this problem includes:    Metformin Hcl 1000 Mg Tabs (Metformin hcl) .Marland Kitchen... Take 1 tablet by mouth two times a day    Acarbose 25 Mg Tabs (Acarbose) .Marland Kitchen... Take 2 tablets three times a day    Lisinopril 40 Mg Tabs (Lisinopril) .Marland Kitchen... Take 1 tablet by mouth once a day    Diovan Hct 320-25 Mg Tabs (Valsartan-hydrochlorothiazide) .Marland Kitchen... Take 1 tablet by mouth once a day    Aspirin 81 Mg Tabs (Aspirin) .Marland Kitchen... Take 1 tablet by mouth once a day  Orders: Misc. Referral (Misc. Ref)  Problem # 2:  SCREENING, COLON CANCER (ICD-V76.51) sent home with IFOB.  Colonscopy done at 50.   Complete Medication List: 1)  Metformin Hcl 1000 Mg Tabs (Metformin hcl) .... Take 1 tablet by mouth two times a day 2)  Acarbose 25 Mg Tabs (Acarbose) .... Take 2 tablets three times a day 3)  Lisinopril 40 Mg Tabs (Lisinopril) .... Take 1 tablet by mouth once a day 4)  Metoprolol Succinate 100 Mg Xr24h-tab (Metoprolol  succinate) .... Take 1-1/2  tablets  by mouth once a day 5)  Multivitamins Tabs (Multiple vitamin) .... Take 1 tablet by mouth once a day 6)  Diovan Hct 320-25 Mg Tabs (Valsartan-hydrochlorothiazide) .... Take 1 tablet by mouth once a day 7)  Tums Ultra 1000 1000 Mg Chew (Calcium carbonate antacid) .... Chew one daily 8)  Amlodipine Besylate 10 Mg Tabs (Amlodipine besylate) .... Take 1 tablet by mouth once daily 9)  Aspirin 81 Mg Tabs (Aspirin) .... Take 1 tablet by mouth once a day  Other Orders: Tdap => 21yrs IM (04540) Admin 1st Vaccine (98119)  Patient Instructions: 1)  See Shirlee Limerick about your referral before your leave today.   2)  Work on [employer] and walk more for exercise.   3)  I want to recheck your A1c in 3 months with a visit a few days after that.  250.00 4)  Take care.  Glad to see you today.   Orders Added: 1)  Est. Patient Level IV [14782] 2)  Misc. Referral [Misc. Ref] 3)  Tdap => 86yrs IM [90715] 4)  Admin 1st Vaccine [90471]   Immunizations Administered:  Tetanus Vaccine:    Vaccine Type: Tdap    Site: left deltoid    Mfr: GlaxoSmithKline    Dose: 0.5 ml    Route: IM    Given by: Delilah Shan CMA (AAMA)    Exp. Date: 11/30/2011    Lot #: [id number]    VIS given: 12/29/07 version given April 02, 2010.   Immunizations Administered:  Tetanus Vaccine:    Vaccine Type: Tdap    Site: left deltoid    Mfr: GlaxoSmithKline    Dose: 0.5 ml    Route: IM    Given by: Delilah Shan CMA (AAMA)    Exp. Date: 11/30/2011    Lot #: [id number]    VIS given: 12/29/07 version given April 02, 2010.  Current Allergies (reviewed today): No known allergies

## 2010-04-10 ENCOUNTER — Other Ambulatory Visit: Payer: BC Managed Care – PPO

## 2010-04-10 ENCOUNTER — Other Ambulatory Visit: Payer: Self-pay | Admitting: Family Medicine

## 2010-04-10 ENCOUNTER — Encounter (INDEPENDENT_AMBULATORY_CARE_PROVIDER_SITE_OTHER): Payer: Self-pay | Admitting: *Deleted

## 2010-04-10 DIAGNOSIS — Z1289 Encounter for screening for malignant neoplasm of other sites: Secondary | ICD-10-CM

## 2010-04-10 LAB — FECAL OCCULT BLOOD, IMMUNOCHEMICAL: Fecal Occult Bld: NEGATIVE

## 2010-04-11 ENCOUNTER — Encounter (INDEPENDENT_AMBULATORY_CARE_PROVIDER_SITE_OTHER): Payer: Self-pay | Admitting: *Deleted

## 2010-04-16 ENCOUNTER — Encounter: Payer: Self-pay | Admitting: Family Medicine

## 2010-04-18 ENCOUNTER — Encounter: Payer: BC Managed Care – PPO | Attending: Family Medicine | Admitting: Dietician

## 2010-04-18 DIAGNOSIS — E119 Type 2 diabetes mellitus without complications: Secondary | ICD-10-CM | POA: Insufficient documentation

## 2010-04-18 DIAGNOSIS — Z713 Dietary counseling and surveillance: Secondary | ICD-10-CM | POA: Insufficient documentation

## 2010-04-18 NOTE — Letter (Signed)
Summary: Results Follow up Letter  [place] [facility]  [address] [place], [place] [address]   Phone: [phone]  Fax: [phone]    04/11/2010 MRN: [id number]    [facility] [address] [place], [place]  [address]  [place]    Dear Mr. Necaise,  The following are the results of your recent test(s):  Test         Result    Pap Smear:        Normal _____  Not Normal _____ Comments: ______________________________________________________ Cholesterol: LDL(Bad cholesterol):         Your goal is less than:         HDL (Good cholesterol):       Your goal is more than: Comments:  ______________________________________________________ Mammogram:        Normal _____  Not Normal _____ Comments:  ___________________________________________________________________ Hemoccult:        Normal __X___  Not normal _______ Comments:    _____________________________________________________________________ Other Tests:    We routinely do not discuss normal results over the telephone.  If you desire a copy of the results, or you have any questions about this information we can discuss them at your next office visit.   Sincerely,    Dwana Curd. Para March, M.D.  [facility]

## 2010-04-23 NOTE — Letter (Signed)
Summary: Notification of Patient Appt. at [facility]  Notification of Patient Appt[facility] at [facility]   Imported By: Maryln Gottron 04/18/2010 15:31:12  _____________________________________________________________________  External Attachment:    Type:   Image     Comment:   External Document

## 2010-04-29 LAB — GLUCOSE, CAPILLARY

## 2010-04-30 NOTE — Letter (Signed)
Summary: [facility]  [facility]   Imported By: Kassie Mends 04/22/2010 09:42:24  _____________________________________________________________________  External Attachment:    Type:   Image     Comment:   External Document

## 2010-05-15 LAB — CREATININE, SERUM
Creatinine, Ser: 0.82 mg/dL (ref 0.4–1.5)
GFR calc Af Amer: >60 mL/min (ref >60)
GFR calc non Af Amer: >60 mL/min (ref >60)

## 2010-06-18 ENCOUNTER — Telehealth: Payer: Self-pay | Admitting: *Deleted

## 2010-06-18 NOTE — Telephone Encounter (Signed)
Pt is coming in for visit

## 2010-06-28 NOTE — Op Note (Signed)
NAME:  Alexander [name], Alexander [name]               ACCOUNT NO.:  [id number]   MEDICAL RECORD NO.:  [id number]          PATIENT TYPE:  OIB   LOCATION:  2550                         FACILITY:  [facility]   PHYSICIAN:  Leonie Man, M.D.   DATE OF BIRTH:  06/20/1950   DATE OF PROCEDURE:  11/08/2003  DATE OF DISCHARGE:                                 OPERATIVE REPORT   PREOPERATIVE DIAGNOSIS:  Right thyroid nodule, rule out carcinoma.   POSTOPERATIVE DIAGNOSIS:  Right thyroid nodule, rule out carcinoma,  pathology pending.   OPERATION PERFORMED:  Right thyroid lobectomy with isthmectomy.   SURGEON:  Leonie Man, M.D.   ASSISTANT:  Ollen Gross. Carolynne Edouard, M.D.   ANESTHESIA:  General.   INDICATIONS FOR PROCEDURE:  The patient is a 60 year old man who presents  with a large right thyroid nodule measuring approximately 3.8 cm in greatest  diameter.  On fine needle aspiration biopsy, the lesion showed this to be a  follicular lesion.  There was some question about whether this could be a  mixed follicular or papillary follicular lesion.  The patient was  recommended to have surgery for removal of this nodule.  The risks and  benefits of surgery were fully discussed with the patient including the  possibility of total thyroidectomy or a staged total thyroidectomy if this  turns out to be a carcinoma.  The risks of hypoparathyroidism or recurrent  laryngeal nerve injury were also discussed in detail.  The patient seems to  understand all of this and gives consent to surgery.   DESCRIPTION OF PROCEDURE:  Following the induction of satisfactory general  anesthesia, the patient was positioned supinely with head and neck  hyperextended.  The neck was prepped and draped to be included in a sterile  operative field.  A collar incision approximately two fingerbreadths above  the sternal notch was carried down through the skin and subcutaneous tissues  raising a myocutaneous flap off the platysma muscle up to the  thyroid  cartilage and inferiorly down to sternal notch.  The strap muscles were  opened up in their midline carrying this down to the capsule of the thyroid.  Palpation of the left thyroid did not reveal any abnormalities.  There were  no palpable abnormalities such as lymph nodes within the neck.  The thyroid  nodule on the right was palpated.  This was dissected down upon carrying the  dissection to the right laterally.  At the inferior pole the recurrent  laryngeal nerve was dissected free, identified and preserved throughout the  course of the dissection.  The inferior thyroid vessels were taken between  clamps and doubly clipped.  The thyroid lobe was then dissected free from  above the nerve carrying dissection superiorly.  This dissection was carried  up to the superior pole.  The superior pole vessels were taken between  clamps and doubly tied with 2-0 silk sutures.  The thyroid was then  dissected free from the underlying trachea carrying the dissection over also  involving the thyroid isthmus over to the left lobe.  The thyroid was then  divided between the  isthmus and the left lobe and forwarded for pathologic  evaluation.  The raw end of the thyroid was then closed with interrupted 3-0  Vicryl sutures.  Pathologic evaluation on frozen section showed a cellular  follicular lesion but diagnosis of cancer could not be made based on frozen  section.  All areas of dissection were then checked for hemostasis.  Additional bleeding points treated with electrocautery.  Sponge, instrument  and sharp counts were fully verified.  The strap muscles were closed in the  midline with a running suture of 3-0 Vicryl.  The platysma muscle was  reapproximated with interrupted 3-0 Vicryl sutures.  The skin was closed  with a running  4-0 Monocryl suture.  The wound was reinforced with Steri-Strips.  Sterile  dressings were applied.  Anesthetic was reversed and the patient removed  from the  operating room to the recovery room in stable condition.  He  tolerated the procedure well.      Patr   PB/MEDQ  D:  11/08/2003  T:  11/08/2003  Job:  [id number]   cc:   Chales Salmon. Abigail Miyamoto, M.D.  [address]  [place]  [place] [address]  Fax: [phone]

## 2010-06-28 NOTE — Discharge Summary (Signed)
NAME:  Geathers, Willoughby               ACCOUNT NO.:  [id number]   MEDICAL RECORD NO.:  [id number]          PATIENT TYPE:  OIB   LOCATION:  5707                         FACILITY:  [facility]   PHYSICIAN:  Leonie Man, M.D.   DATE OF BIRTH:  12/20/1950   DATE OF ADMISSION:  11/08/2003  DATE OF DISCHARGE:  11/09/2003                                 DISCHARGE SUMMARY   ADMISSION DIAGNOSIS:  Right thyroid lobe nodule, rule out carcinoma.   DISCHARGE DIAGNOSIS:  Right thyroid lobe nodule, rule out carcinoma.  Pathology pending.   PROCEDURE:  Right lobectomy and isthmectomy.   COMPLICATIONS:  None.   CONDITION ON DISCHARGE:  Improved.   HISTORY OF PRESENT ILLNESS:  Mr. Alberico is a 60 year old man noted to have a  3.8 cm nodule in the left thyroid which on fine needle aspiration showed a  follicular lesion consistent with a follicular adenoma but I could not rule  out papillary follicular variant of carcinoma.  He came to the operating  room on the day of admission and underwent right thyroid lobectomy.   HOSPITAL COURSE:  His postoperative course has been benign.  On examination,  his voice is normal.  His swallowing is normal and he is not having any  stridor.  Morning serum calcium results have not yet returned.  The patient  has no tetania.   He is being discharged now to be followed up in the office in two weeks.   DISCHARGE MEDICATIONS:  1.  Vicodin 1-2 q.4h. p.r.n. pain.  2.  The patient is advised to take Tums 2 tablets b.i.d.  3.  He is to resume his usual home medications which include Norvasc 5 mg      q.d.  4.  Hydrochlorothiazide 25 mg q.d.  5.  Zetia 10 mg q.d.  6.  Altace 10 mg q.d.  7.  Metformin 1000 mg b.i.d.   ACTIVITY:  As tolerated.   DIET:  Restricted to his usual diabetic diet.     Patr  PB/MEDQ  D:  11/09/2003  T:  11/09/2003  Job:  [id number]

## 2010-07-09 ENCOUNTER — Other Ambulatory Visit: Payer: BC Managed Care – PPO

## 2010-07-11 ENCOUNTER — Encounter: Payer: Self-pay | Admitting: Family Medicine

## 2010-07-11 ENCOUNTER — Ambulatory Visit: Payer: BC Managed Care – PPO | Admitting: Family Medicine

## 2010-07-16 ENCOUNTER — Other Ambulatory Visit (INDEPENDENT_AMBULATORY_CARE_PROVIDER_SITE_OTHER): Payer: BC Managed Care – PPO

## 2010-07-16 ENCOUNTER — Other Ambulatory Visit: Payer: BC Managed Care – PPO

## 2010-07-16 DIAGNOSIS — E119 Type 2 diabetes mellitus without complications: Secondary | ICD-10-CM

## 2010-07-18 ENCOUNTER — Ambulatory Visit (INDEPENDENT_AMBULATORY_CARE_PROVIDER_SITE_OTHER): Payer: BC Managed Care – PPO | Admitting: Family Medicine

## 2010-07-18 ENCOUNTER — Encounter: Payer: Self-pay | Admitting: Family Medicine

## 2010-07-18 VITALS — BP 136/84 | HR 64 | Temp 97.8°F | Ht 75.75 in | Wt 265.1 lb

## 2010-07-18 DIAGNOSIS — E119 Type 2 diabetes mellitus without complications: Secondary | ICD-10-CM

## 2010-07-18 NOTE — Progress Notes (Signed)
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  They have built in group counseling as part of the program and this has been useful.    Diabetes:  Using medications without difficulties:yes Hypoglycemic episodes: no Hyperglycemic episodes: no Feet problems: no Blood Sugars averaging: ~120-130, always <150 eye exam within last year: yes. Occ blurry vision and I rec'd fu with eye clinic.  He has bifocals and he is improved with the lower portion of the lens.   Labs d/w pt.  A1c is improved.    PMH and SH reviewed  Meds, vitals, and allergies reviewed.   ROS: See HPI.  Otherwise negative.    GEN: nad, alert and oriented HEENT: mucous membranes moist NECK: supple w/o LA CV: rrr. PULM: ctab, no inc wob ABD: soft, +bs EXT: no edema SKIN: no acute rash  Diabetic foot exam: Normal inspection except for mild tinea changes . No skin breakdown No calluses  Normal DP pulses Normal sensation to light touch and monofilament Nails except for cracked L 1st nail- d/w pt at about trimming nails straight across and letting this grow out

## 2010-07-18 NOTE — Assessment & Plan Note (Addendum)
>  25 min spent with face to face with patient >50% counseling.  Improved with diet and exercise changes.  He'll work on foot care.  Plan for fu this fall.

## 2010-07-18 NOTE — Patient Instructions (Addendum)
I would talk to the eye clinic. Tell them about the changes when you use your bifocals.   Don't change your meds.  I would recheck your labs in about 4 months.  Call about setting up an A1c at [facility].  visit a few days after the labs. Let me know if you have concerns in the meantime.

## 2010-09-14 ENCOUNTER — Other Ambulatory Visit: Payer: Self-pay | Admitting: Family Medicine

## 2010-10-22 ENCOUNTER — Other Ambulatory Visit: Payer: Self-pay | Admitting: Family Medicine

## 2010-11-12 ENCOUNTER — Other Ambulatory Visit: Payer: Self-pay | Admitting: Family Medicine

## 2010-11-18 ENCOUNTER — Ambulatory Visit (INDEPENDENT_AMBULATORY_CARE_PROVIDER_SITE_OTHER): Payer: BC Managed Care – PPO | Admitting: Family Medicine

## 2010-11-18 ENCOUNTER — Ambulatory Visit: Payer: BC Managed Care – PPO | Admitting: Family Medicine

## 2010-11-18 ENCOUNTER — Encounter: Payer: Self-pay | Admitting: Family Medicine

## 2010-11-18 VITALS — BP 106/80 | HR 61 | Temp 97.9°F | Wt 268.0 lb

## 2010-11-18 DIAGNOSIS — Z23 Encounter for immunization: Secondary | ICD-10-CM

## 2010-11-18 DIAGNOSIS — B182 Chronic viral hepatitis C: Secondary | ICD-10-CM

## 2010-11-18 DIAGNOSIS — B1920 Unspecified viral hepatitis C without hepatic coma: Secondary | ICD-10-CM

## 2010-11-18 DIAGNOSIS — E119 Type 2 diabetes mellitus without complications: Secondary | ICD-10-CM

## 2010-11-18 NOTE — Patient Instructions (Addendum)
See Shirlee Limerick about your referral before your leave today. You can get your results through our phone system.  Follow the instructions on the blue card. Schedule a physical for 2/13 with labs ahead of time.   Take care and try to start walking again.

## 2010-11-18 NOTE — Assessment & Plan Note (Signed)
Refer back to [facility] per patient request.

## 2010-11-18 NOTE — Progress Notes (Signed)
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  He plans on getting back into a routine of walking, with his wife.   He isn't eating regularly and the he overeats.   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    H/o HCV and would like referral back to [facility].  No abd pain, jaundice, vomiting, blood in stool.   Meds, vitals, and allergies reviewed.   ROS: See HPI.  Otherwise negative.    GEN: nad, alert and oriented HEENT: mucous membranes moist NECK: supple w/o LA CV: rrr. PULM: ctab, no inc wob ABD: soft, +bs EXT: no edema SKIN: no acute rash  Diabetic foot exam: Normal inspection No skin breakdown No calluses  Normal DP pulses Normal sensation to light touch and monofilament Nails thickened, cracked on L 1st nail

## 2010-11-18 NOTE — Assessment & Plan Note (Signed)
>  25 min spent with face to face with patient, >50% counseling.  Start back with walking and check A1c today.  He agrees with plan.  No change in meds today.

## 2011-01-10 ENCOUNTER — Other Ambulatory Visit: Payer: Self-pay | Admitting: Family Medicine

## 2011-01-27 ENCOUNTER — Encounter: Payer: Self-pay | Admitting: Family Medicine

## 2011-01-27 DIAGNOSIS — J329 Chronic sinusitis, unspecified: Secondary | ICD-10-CM | POA: Insufficient documentation

## 2011-02-05 ENCOUNTER — Encounter: Payer: Self-pay | Admitting: Family Medicine

## 2011-03-07 ENCOUNTER — Telehealth: Payer: Self-pay | Admitting: Family Medicine

## 2011-03-07 NOTE — Telephone Encounter (Signed)
Patient called asking to have copies of the following: immunization, TB test results and xray. He will need them when he goes to his appointment next week to see specialist. If you have any questions, he can best be reached at [phone].JB

## 2011-03-11 NOTE — Telephone Encounter (Signed)
I don't see that the copy of the letter was scanned.  He wouldn't need a PPD as his prev PPD was positive.  They may require a new CXR.  If I can get a copy of the requirements for the new job, I'll look at it.  Thanks.

## 2011-03-11 NOTE — Telephone Encounter (Signed)
Patient advised.

## 2011-03-11 NOTE — Telephone Encounter (Signed)
I have tried to find the information that this pt is looking for and have asked for help from [facility] and she is not able to find a PPD reading or a recent CXR either.  He is applying for a Chaplain position.  I have printed off the immunizations that are recorded in our system and will fax them to the number that he has requested.  [facility]  [phone].  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  He said he thought that letter included some immunization info that he might need.  Can you shed any light on this situation?  He needs this faxed by tomorrow.

## 2011-03-13 ENCOUNTER — Telehealth: Payer: Self-pay | Admitting: *Deleted

## 2011-03-13 NOTE — Telephone Encounter (Signed)
Needs a note from you stating that he tested positive on his PPD.  He has already had a CXR but still needs this note.  He is asking again if we have any documentation for the Hepatitis B series that he has taken.  He says it may have been done at [facility].  They also want to know if he had a Tdap or Td. [facility]. Fax: [phone]

## 2011-03-14 NOTE — Telephone Encounter (Signed)
Note faxed. Patient advised. 

## 2011-03-14 NOTE — Telephone Encounter (Signed)
See letter.

## 2011-03-23 ENCOUNTER — Other Ambulatory Visit: Payer: Self-pay | Admitting: Family Medicine

## 2011-04-08 ENCOUNTER — Ambulatory Visit (INDEPENDENT_AMBULATORY_CARE_PROVIDER_SITE_OTHER): Payer: BC Managed Care – PPO | Admitting: Family Medicine

## 2011-04-08 ENCOUNTER — Encounter: Payer: Self-pay | Admitting: Family Medicine

## 2011-04-08 VITALS — BP 142/86 | HR 64 | Temp 97.7°F | Wt 266.0 lb

## 2011-04-08 DIAGNOSIS — K7460 Unspecified cirrhosis of liver: Secondary | ICD-10-CM

## 2011-04-08 DIAGNOSIS — Z125 Encounter for screening for malignant neoplasm of prostate: Secondary | ICD-10-CM

## 2011-04-08 DIAGNOSIS — Z0000 Encounter for general adult medical examination without abnormal findings: Secondary | ICD-10-CM

## 2011-04-08 DIAGNOSIS — J329 Chronic sinusitis, unspecified: Secondary | ICD-10-CM

## 2011-04-08 DIAGNOSIS — I10 Essential (primary) hypertension: Secondary | ICD-10-CM

## 2011-04-08 DIAGNOSIS — B182 Chronic viral hepatitis C: Secondary | ICD-10-CM

## 2011-04-08 DIAGNOSIS — E119 Type 2 diabetes mellitus without complications: Secondary | ICD-10-CM

## 2011-04-08 LAB — COMPREHENSIVE METABOLIC PANEL
Albumin: 4 g/dL (ref 3.5–5.2)
CO2: 31 mEq/L (ref 19–32)
GFR: 115.05 mL/min (ref >60.00)
Glucose, Bld: 150 mg/dL — ABNORMAL HIGH (ref 70–99)
Potassium: 3.7 mEq/L (ref 3.5–5.1)
Sodium: 139 mEq/L (ref 135–145)
Total Protein: 7.4 g/dL (ref 6.0–8.3)

## 2011-04-08 LAB — TSH: TSH: 1.19 u[IU]/mL (ref 0.35–5.50)

## 2011-04-08 LAB — LIPID PANEL: HDL: 34.1 mg/dL — ABNORMAL LOW (ref >39.00)

## 2011-04-08 LAB — HEMOGLOBIN A1C: Hgb A1c MFr Bld: 8.1 % — ABNORMAL HIGH (ref 4.6–6.5)

## 2011-04-08 LAB — PSA: PSA: 0.44 ng/mL (ref 0.10–4.00)

## 2011-04-08 NOTE — Patient Instructions (Signed)
Don't change your meds for now.  Take care.  Plan on rechecking sugar in 6 months at a visit.   If the puffiness continues, talk with Dr. Haroldine Laws.   Take care and keep exercising.   Glad to see you.

## 2011-04-08 NOTE — Progress Notes (Signed)
CPE- See plan.  Routine anticipatory guidance given to patient.  See health maintenance.  He is exercising now, walking more at work.  Working at [employer]	[place], as a Orthoptist.  Enjoying the work.    Colon cancer screening.  Discussed with patient.  Colonoscopy prev done in 2005.    Prostate cancer screening.  We talked about this.  Will check PSA today.  No dysuria.   Had f/u with [facility] re: HCV in 3/13.  No abd pain, vomiting, jaundice.    Has f/u with ENT.  Slightly darker under the eyes on the lower lids.  This could be allergy related.   Diabetes:  Using medications without difficulties: yes Hypoglycemic episodes:no Hyperglycemic episodes:no Feet problems:no Blood Sugars averaging: 130 this AM, typical  eye exam within last year:yes  Hypertension:    Using medication without problems or lightheadedness: yes Chest pain with exertion: no Edema:no Short of breath:no Other issues:due for labs.    PMH and SH reviewed.   Vital signs, Meds and allergies reviewed.  ROS: See HPI.  Otherwise nontributory.   GEN: nad, alert and oriented HEENT: mucous membranes moist, allergic shiners noted, op wnl NECK: supple w/o LA CV: rrr PULM: ctab, no inc wob ABD: soft, +bs EXT: no edema SKIN: no acute rash Prostate gland firm and smooth, no enlargement, nodularity, tenderness, mass, asymmetry or induration.  Diabetic foot exam: Normal inspection No skin breakdown No calluses  Normal DP pulses Normal sensation to light tough and monofilament Nails normal

## 2011-04-10 DIAGNOSIS — Z0000 Encounter for general adult medical examination without abnormal findings: Secondary | ICD-10-CM | POA: Insufficient documentation

## 2011-04-10 MED ORDER — LISINOPRIL 40 MG PO TABS: 40.0000 mg | ORAL_TABLET | Freq: Every day | ORAL | Status: DC

## 2011-04-10 MED ORDER — ACARBOSE 25 MG PO TABS: ORAL_TABLET | ORAL | Status: DC

## 2011-04-10 MED ORDER — METFORMIN HCL 1000 MG PO TABS: 1000.0000 mg | ORAL_TABLET | Freq: Two times a day (BID) | ORAL | Status: DC

## 2011-04-10 MED ORDER — VALSARTAN-HYDROCHLOROTHIAZIDE 320-25 MG PO TABS: 1.0000 | ORAL_TABLET | Freq: Every day | ORAL | Status: DC

## 2011-04-10 MED ORDER — METOPROLOL SUCCINATE ER 100 MG PO TB24: ORAL_TABLET | ORAL | Status: DC

## 2011-04-10 MED ORDER — AMLODIPINE BESYLATE 10 MG PO TABS: 10.0000 mg | ORAL_TABLET | Freq: Every day | ORAL | Status: DC

## 2011-04-10 NOTE — Assessment & Plan Note (Signed)
See notes on labs.  No change in meds, continue to work on weight via exercise.

## 2011-04-10 NOTE — Assessment & Plan Note (Signed)
Colon up to date.  PSA done today.  Flu and td up to date along with PNA vaccine.   D/w pt about diet and exercise.

## 2011-04-10 NOTE — Assessment & Plan Note (Signed)
He'll f/u with ENT as needed.

## 2011-04-10 NOTE — Assessment & Plan Note (Signed)
See notes on labs.  No jaundice or abd pain.  Will fu with [facility].  Prev [facility] notes reviewed with patient.

## 2011-04-10 NOTE — Assessment & Plan Note (Signed)
No change in meds, continue to work on weight via exercise.

## 2011-04-21 ENCOUNTER — Encounter: Payer: Self-pay | Admitting: Family Medicine

## 2011-04-21 DIAGNOSIS — M79673 Pain in unspecified foot: Secondary | ICD-10-CM | POA: Insufficient documentation

## 2011-05-12 ENCOUNTER — Telehealth: Payer: Self-pay | Admitting: *Deleted

## 2011-05-12 NOTE — Telephone Encounter (Signed)
I would try to get him in with podiatry in [place].

## 2011-05-12 NOTE — Telephone Encounter (Signed)
Patient says he has an ingrown toenail.  Should he see you and if so, how much time to allow?

## 2011-05-12 NOTE — Telephone Encounter (Signed)
Wife advised. 

## 2011-05-13 ENCOUNTER — Other Ambulatory Visit: Payer: Self-pay

## 2011-05-22 ENCOUNTER — Other Ambulatory Visit: Payer: Self-pay | Admitting: Family Medicine

## 2011-06-08 ENCOUNTER — Encounter: Payer: Self-pay | Admitting: Family Medicine

## 2011-06-08 DIAGNOSIS — J309 Allergic rhinitis, unspecified: Secondary | ICD-10-CM | POA: Insufficient documentation

## 2011-06-11 ENCOUNTER — Other Ambulatory Visit: Payer: Self-pay | Admitting: Family Medicine

## 2011-06-11 NOTE — Telephone Encounter (Signed)
Received refill request electronically from pharmacy. Rx is not on medication list. Is it okay to refill?

## 2011-06-11 NOTE — Telephone Encounter (Signed)
Sent!

## 2011-06-30 ENCOUNTER — Telehealth: Payer: Self-pay | Admitting: Family Medicine

## 2011-06-30 NOTE — Telephone Encounter (Signed)
See if he can get in and we'll discuss.  If we change the BP med in question, then he'll likely need to have other meds adjusted.  Thanks.

## 2011-06-30 NOTE — Telephone Encounter (Signed)
LMOVM of home phone. 

## 2011-06-30 NOTE — Telephone Encounter (Signed)
Dr. Lyla Son office was calling concerning Alexander [name] cough. They are saying that his Lisinopril may be contributing to his cough and they suggested he come in for an office visit to see Dr. Para March to discuss meds.

## 2011-07-01 ENCOUNTER — Encounter: Payer: Self-pay | Admitting: Family Medicine

## 2011-07-01 ENCOUNTER — Ambulatory Visit (INDEPENDENT_AMBULATORY_CARE_PROVIDER_SITE_OTHER): Payer: BC Managed Care – PPO | Admitting: Family Medicine

## 2011-07-01 VITALS — BP 132/88 | HR 62 | Temp 97.8°F | Wt 260.0 lb

## 2011-07-01 DIAGNOSIS — R05 Cough: Secondary | ICD-10-CM

## 2011-07-01 MED ORDER — CLONIDINE HCL 0.1 MG PO TABS: 0.1000 mg | ORAL_TABLET | Freq: Two times a day (BID) | ORAL | Status: DC

## 2011-07-01 NOTE — Progress Notes (Signed)
He saw MD at allergy clinic.  Had eval for cough, allergies, sinus disease.  He had a sinus infection but was treated. The cough continued and it was thought he could have an ACE cough.  He continues to have a dry cough, episodic.  It's been going on about 1 month or slightly longer.  No sputum.  Doesn't clearly vary during the day/night, it can be episodic and irregular in onset.  No FCNAV.  No sputum.    Meds, vitals, and allergies reviewed.   ROS: See HPI.  Otherwise, noncontributory.  nad ncat Tm wnl OP wnl Neck supple rrr ctab Dry cough noted

## 2011-07-01 NOTE — Patient Instructions (Signed)
Stop the lisinopril and then start taking 0.1mg  of clonidine twice a day.  Let me know about the cough in about 2 weeks.   Take care.

## 2011-07-03 DIAGNOSIS — R05 Cough: Secondary | ICD-10-CM | POA: Insufficient documentation

## 2011-07-03 NOTE — Assessment & Plan Note (Signed)
Presumed ACE cause. D/w pt. Continue ARB, change to clonidine for BP. He'll call back with update.  D/wpt about not skipping doses of clonidine.  He understood.

## 2011-07-15 ENCOUNTER — Telehealth: Payer: Self-pay | Admitting: *Deleted

## 2011-07-15 NOTE — Telephone Encounter (Signed)
I called patient. We agreed to continue with the current meds.  If worse, we can adjust but with good BP control I would be hesitant to change.  He agrees.

## 2011-07-15 NOTE — Telephone Encounter (Signed)
He says his BP readings have been doing good, around 120 to 129 over 78 to 82.  He says this cramping actually occurs while he is in bed and it is mostly in his lower legs.  He says getting up and walking it off does help to relieve it.

## 2011-07-15 NOTE — Telephone Encounter (Signed)
I'm glad the cough is better.  What has his pressure been after the med change?  Does stretching at night help with the cramps?  Let me know.

## 2011-07-15 NOTE — Telephone Encounter (Signed)
Patient says that he did stop coughing after stopping the Lisinopril.  However, the new medication (Clonidine) that he was put on causes him to get cramps at night.  He says that is a side effect of that medication.  Please advise.

## 2011-09-01 ENCOUNTER — Telehealth: Payer: Self-pay | Admitting: Family Medicine

## 2011-09-01 DIAGNOSIS — I10 Essential (primary) hypertension: Secondary | ICD-10-CM

## 2011-09-01 NOTE — Telephone Encounter (Signed)
Pt would like call back concerning medication.

## 2011-09-01 NOTE — Telephone Encounter (Signed)
He is indicating that he will restart the Lisinopril tomorrow and discontinue the Clonidine.  Please do the order for BMET in [place] on Tuesday, July 30.

## 2011-09-01 NOTE — Telephone Encounter (Signed)
Patient says the Clonidine that you put him on instead of Lisinopril is causing cramping at night.  He is asking if he can  go back to the Lisinopril because he is not coughing now.  I explained that he doesn't have the cough because he is off the Lisinopril.  Is there another medication that he could try or are you willing to restart the Lisinopril?

## 2011-09-01 NOTE — Telephone Encounter (Signed)
I think the cough would come back.  If he isn't bothered by it, he can restart.  If he wants to restart, please call in 90 day supply with 3 rf [lisinopril 40mg  a day].  Same sig as prev.  Stop the clonidine on the day he starts the lisinopril.  If the cough is intolerant, we can consider other options.   If restarted on lisinopril, he'll need repeat BMET 1 week later.

## 2011-09-02 NOTE — Telephone Encounter (Signed)
Ordered

## 2011-09-05 ENCOUNTER — Other Ambulatory Visit: Payer: Self-pay | Admitting: Family Medicine

## 2011-09-10 ENCOUNTER — Other Ambulatory Visit: Payer: Self-pay | Admitting: Family Medicine

## 2011-09-10 ENCOUNTER — Other Ambulatory Visit (INDEPENDENT_AMBULATORY_CARE_PROVIDER_SITE_OTHER): Payer: BC Managed Care – PPO

## 2011-09-10 DIAGNOSIS — I10 Essential (primary) hypertension: Secondary | ICD-10-CM

## 2011-09-10 LAB — BASIC METABOLIC PANEL
Calcium: 8.5 mg/dL (ref 8.4–10.5)
Chloride: 98 mEq/L (ref 96–112)
Creatinine, Ser: 0.9 mg/dL (ref 0.4–1.5)

## 2011-09-10 MED ORDER — LISINOPRIL 40 MG PO TABS: 40.0000 mg | ORAL_TABLET | Freq: Every day | ORAL | Status: DC

## 2011-10-16 ENCOUNTER — Encounter: Payer: Self-pay | Admitting: Family Medicine

## 2011-10-16 ENCOUNTER — Ambulatory Visit (INDEPENDENT_AMBULATORY_CARE_PROVIDER_SITE_OTHER): Payer: BC Managed Care – PPO | Admitting: Family Medicine

## 2011-10-16 VITALS — BP 140/90 | HR 64 | Temp 98.2°F | Ht 74.5 in | Wt 260.5 lb

## 2011-10-16 DIAGNOSIS — E119 Type 2 diabetes mellitus without complications: Secondary | ICD-10-CM

## 2011-10-16 DIAGNOSIS — K759 Inflammatory liver disease, unspecified: Secondary | ICD-10-CM

## 2011-10-16 DIAGNOSIS — B182 Chronic viral hepatitis C: Secondary | ICD-10-CM

## 2011-10-16 DIAGNOSIS — I10 Essential (primary) hypertension: Secondary | ICD-10-CM

## 2011-10-16 LAB — PROTIME-INR
INR: 1.2 ratio — ABNORMAL HIGH (ref 0.8–1.0)
Prothrombin Time: 13.5 s — ABNORMAL HIGH (ref 10.2–12.4)

## 2011-10-16 LAB — CBC WITH DIFFERENTIAL/PLATELET
Basophils Absolute: 0 10*3/uL (ref 0.0–0.1)
HCT: 43.2 % (ref 39.0–52.0)
Lymphs Abs: 1.7 10*3/uL (ref 0.7–4.0)
Monocytes Relative: 12.6 % — ABNORMAL HIGH (ref 3.0–12.0)
Neutrophils Relative %: 41.6 % — ABNORMAL LOW (ref 43.0–77.0)
Platelets: 119 10*3/uL — ABNORMAL LOW (ref 150.0–400.0)
RDW: 13.6 % (ref 11.5–14.6)

## 2011-10-16 LAB — HEMOGLOBIN A1C: Hgb A1c MFr Bld: 8.6 % — ABNORMAL HIGH (ref 4.6–6.5)

## 2011-10-16 LAB — COMPREHENSIVE METABOLIC PANEL
ALT: 62 U/L — ABNORMAL HIGH (ref 0–53)
AST: 62 U/L — ABNORMAL HIGH (ref 0–37)
Albumin: 3.9 g/dL (ref 3.5–5.2)
Alkaline Phosphatase: 49 U/L (ref 39–117)
Potassium: 3.3 mEq/L — ABNORMAL LOW (ref 3.5–5.1)
Sodium: 137 mEq/L (ref 135–145)
Total Protein: 7.3 g/dL (ref 6.0–8.3)

## 2011-10-16 NOTE — Patient Instructions (Addendum)
Go to the lab on the way out.  We'll contact you with your lab report. Take care.  I would get a flu shot each fall.   

## 2011-10-16 NOTE — Progress Notes (Signed)
Hypertension:  Started back on lisinopril but the cough isn't bothersome  Using medication without problems or lightheadedness: yes Chest pain with exertion:no Edema:no Short of breath:no He has less cramps off clonidine.   He has a globus sensation for the last few weeks.  No sx in AM but noted later in the day.  He'll clear out a small amount of sputum with throat clearing.  He has f/u with allergy clinic pending.  He didn't know if this was from post nasal gtt.    Diabetes:  Using medications without difficulties: yes Hypoglycemic episodes: no Hyperglycemic episodes: yes, up to ~170 in AM Feet problems:no Blood Sugars averaging: as above Exercise limited over the summer.  Discussed.    Work is going well.    He has f/u with the hepatitis clinic in [place] in about 2 weeks.    PMH and SH reviewed  Meds, vitals, and allergies reviewed.   ROS: See HPI.  Otherwise negative.    GEN: nad, alert and oriented, overweight.  HEENT: mucous membranes moist, tm wnl, nasal and OP exam wnl NECK: supple w/o LA CV: rrr. PULM: ctab, no inc wob ABD: soft, +bs EXT: no edema SKIN: no acute rash  Diabetic foot exam: Normal inspection No skin breakdown No calluses  Normal DP pulses Normal sensation to light touch and monofilament Nails normal

## 2011-10-17 MED ORDER — INSULIN GLARGINE 100 UNIT/ML ~~LOC~~ SOLN: SUBCUTANEOUS | Status: DC

## 2011-10-17 MED ORDER — INSULIN PEN NEEDLE 31G X 5 MM MISC: Status: DC

## 2011-10-17 NOTE — Assessment & Plan Note (Addendum)
Per [facility], f/u pending.  See notes on labs.  No bleeding or abd pain, no jaundice.

## 2011-10-17 NOTE — Assessment & Plan Note (Signed)
Continue ACE.  I don't think the globus sensation is ACE related.  He'll f/u with allergy clinic [id number] post nasal gtt.  This could also be GERD related.  I'll await allergy clinic input.

## 2011-10-17 NOTE — Assessment & Plan Note (Signed)
See notes on labs.  Will likely need insulin.  Diet and weight, exercise discussed.

## 2011-10-17 NOTE — Addendum Note (Signed)
Addended by: Joaquim Nam on: 10/17/2011 10:45 AM   Modules accepted: Orders

## 2011-10-23 ENCOUNTER — Telehealth: Payer: Self-pay

## 2011-10-23 NOTE — Telephone Encounter (Signed)
They usually do give advice about potential drops.  I would think that using a snack and holding the precose temporarily would be reasonable.

## 2011-10-23 NOTE — Telephone Encounter (Signed)
Pt has appt for diabetic teaching on 11/05/11; after pt starts insulin is he to continue taking Precose and Metformin. Pt also wants to know what he should do after starting insulin if his bs drops. Advised pt that diabetic teaching usually gives good verbal and written information about symptoms to look for and what to do if bs drops. Pt states FBS now is averaging 145 - 150 now.Please advise.

## 2011-10-23 NOTE — Telephone Encounter (Signed)
Patient advised.

## 2011-11-05 ENCOUNTER — Encounter: Payer: Self-pay | Admitting: Dietician

## 2011-11-05 ENCOUNTER — Encounter: Payer: BC Managed Care – PPO | Attending: Family Medicine | Admitting: Dietician

## 2011-11-05 VITALS — Ht 74.0 in | Wt 263.8 lb

## 2011-11-05 DIAGNOSIS — IMO0001 Reserved for inherently not codable concepts without codable children: Secondary | ICD-10-CM | POA: Insufficient documentation

## 2011-11-05 DIAGNOSIS — E119 Type 2 diabetes mellitus without complications: Secondary | ICD-10-CM

## 2011-11-05 NOTE — Progress Notes (Addendum)
  Medical Nutrition Therapy:  Appt start time: 0900 end time:  1000.   Assessment:  Primary concerns today: Learn what to eat, review my diet and to get the insulin pen demonstrated.  History of diabetes for 13 years.  Received diabetes education early on in his diagnosis.  Currently his A1C is at 8.7 %.  He is to start Lantus for glucose control.  He is to return to his MD and will receive a dosage at that time.  We reviewed the pen and he was able by return demonstration without injecting, the preparation of the insulin pen.    BLOOD GLUCOSE MONITORING:  Currently monitoring fasting glucose.  Fasting: 133, 150, 160, 140, mg. Request a new blood glucose meter.  Provided a new Accu-Chek Aviva meter Lot: [id number] Exp: 06/13/2014 and Aviva Strips Lot: [id number] Exp: 02/09/2013.   HYPOGLYCEMIA:  Gives no S/S for low blood glucose.  HYPERGLYCEMIA:  Gives S/S of blurred vision, thirst, and increased urination.  DILATED EYE EXAMINATION:  Gives no history for a dilated exam.  Advised to wait and obtain when the blood glucose levels are closer to 150 and more stable.  MEDICATIONS: Medication review completed.  DM medications include:  Lantus to start,  Glucophage 1000 mg AM and PM, Acarbose 25 mg three times a day before meals. (gives a history of not taking the mid-day dose with lunch.)   DIETARY INTAKE:  Usual eating pattern includes 2-3 meals and no snacks per day.   24-hr recall:  B ( AM): 9:00 bowl of cereal (cornfakes , raisin bran, cheerios 1.5 cup X 2 with milk whole or 2% milk or almond milk, 8 oz) uses [place].  Water to drink  Snk ( AM): none  L ( PM): Jenna Ardoin/maynot eat. 2:30  Chicken sandwich, fries, small fries, sweet tea, small to medium size  Will eat lunch 2-3 days per week depends on appetite. Snk ( PM): none D ( PM): 7-8:00 sandwich ([place] lettuce, tomato, cheese, mayo, bread =whole wheat) 2 sandwiches with chips, 1.5 serving of chips, water. Snk ( PM): none Beverages: water, diet coke  or zero coke,   Usual physical activity: Currently not walking.   Estimated energy needs:  HT: 74.5 in  WT: 263.8 lb  BMI: 33.9 kg/m2  Adj. WT: 219 lb (100 kg) 1800 calories for wt loss 200-205 g carbohydrates 130-135 g protein 48-50 g fat  Progress Towards Goal(s):  In progress.   Nutritional Diagnosis:  [place]-2.1 Inpaired nutrition utilization As related to glucose metabolism.  As evidenced by diagnosis of type 2 diabetes, A1C of 8.7%, elevated fasting blood glucose levels..    Intervention:  Nutrition Review of the use of a restricted CHO diet for the lowering of blood glucose.  Cautioned regarding the use of the sweetened foods and beverages and their impact on blood glucose, triglycerides and weight gain.  Recommended taking his blood glucose at mid-day to see how well the levels were staying down.  Also recommended taking a glucose reading 2 hours after the first bite of his largest meal of the day to evaluate the effects of the Precose.    Handouts given during visit include:  Living Well with Diabetes  Controlling Blood Glucose  Menu suggestions for 1800 calorie/60 gm CHO diet  Snack list.  Monitoring/Evaluation:  Dietary intake, exercise, blood glucose levels, and body weight in 8-12 weeks.  To call with issues or questions.

## 2011-11-09 ENCOUNTER — Telehealth: Payer: Self-pay | Admitting: Family Medicine

## 2011-11-09 NOTE — Telephone Encounter (Signed)
Call pt.  He had DM2 education.  Have his start with lantus at 5 units per day. Increase by 1 unit per day until AM sugar is <120.  Thanks.  Keep the f/u OV with me later this year.  If he has having hypoglycemia, <70, then dec his dose by 1 unit a day until he's 80-120 in AM.

## 2011-11-10 NOTE — Telephone Encounter (Signed)
LMOVM to return call.

## 2011-11-10 NOTE — Telephone Encounter (Signed)
Patient advised.

## 2011-11-14 ENCOUNTER — Other Ambulatory Visit: Payer: Self-pay | Admitting: Family Medicine

## 2011-11-25 ENCOUNTER — Ambulatory Visit (INDEPENDENT_AMBULATORY_CARE_PROVIDER_SITE_OTHER): Payer: BC Managed Care – PPO | Admitting: Family Medicine

## 2011-11-25 ENCOUNTER — Encounter: Payer: Self-pay | Admitting: Family Medicine

## 2011-11-25 VITALS — BP 122/86 | HR 62 | Temp 97.9°F | Wt 262.8 lb

## 2011-11-25 DIAGNOSIS — E119 Type 2 diabetes mellitus without complications: Secondary | ICD-10-CM

## 2011-11-25 MED ORDER — ACARBOSE 25 MG PO TABS: ORAL_TABLET | ORAL | Status: DC

## 2011-11-25 MED ORDER — GLUCOSE BLOOD VI STRP: ORAL_STRIP | Status: DC

## 2011-11-25 NOTE — Progress Notes (Signed)
He has a change in his throat, not a pain but an annoyance.  No trouble swallowing or eating, but he feels like something is in his throat.  He didn't know if it was from reflux or post nasal gtt.  He doesn't have trouble in the AM, but notes it more as the day goes on.  Going on for a few months, not an acute onset. No FCNAVD.   No ear pain. Some rhinorrhea, treated with nasal saline. "My wife says I eat too fast."  No coughing after eating.  No heartburn noted by patient.  Prev seen at allergy clinic.  Cough is resolved.    DM2.  Started on lantus insulin for DM2 in meantime.  Taking 10 units a day.  AM sugars ~120 and no lows.  He's exercising and working on his diet.  He needs a new rx for strips. Doing well with the injections.    He'll get flu shot at work.   Meds, vitals, and allergies reviewed.   ROS: See HPI.  Otherwise, noncontributory.  nad ncat Tm wnl Nasal exam with mild irritation OP with some post nasal gtt but no erythema Neck supple rrr ctab

## 2011-11-25 NOTE — Assessment & Plan Note (Signed)
Doing well with lantus, recheck A1c in ~2 months.  Can scale back checking sugar to 1-2x/day as needed, esp if sx of hypoglycemia, reviewed with patient.  He agrees.

## 2011-11-25 NOTE — Patient Instructions (Signed)
Restart the xyzal and patanase and see if that helps the throat symptoms.   Recheck A1c in 12/13 before a visit.   Check your sugar each morning and then as needed.

## 2011-11-25 NOTE — Assessment & Plan Note (Signed)
Restart the xyzal and patanase for likely post nasal gtt causing his symptoms.  No stridor and I don't suspect ACE involvement.  He agrees with plan.

## 2011-12-31 ENCOUNTER — Encounter: Payer: BC Managed Care – PPO | Attending: Family Medicine | Admitting: Dietician

## 2011-12-31 VITALS — Ht 74.0 in | Wt 260.5 lb

## 2011-12-31 DIAGNOSIS — E119 Type 2 diabetes mellitus without complications: Secondary | ICD-10-CM

## 2011-12-31 DIAGNOSIS — IMO0001 Reserved for inherently not codable concepts without codable children: Secondary | ICD-10-CM | POA: Insufficient documentation

## 2011-12-31 NOTE — Progress Notes (Signed)
Medical Nutrition Therapy:  Appt start time: 0915end time:  0945.  Assessment:  Primary concerns today: Diabetes F/u.  Has been working at establishing a lifestyle routine that will help with glucose control and weight control.  Has ben more physically active over the last few weeks.  Has not been as consistent with reading food labels, but is eating at regular intervals and trying to more closely monitor his portion sizes.  Since his last visit with me on 11/05/2011, he has lost 3.25 lbs.  He is consistently going to the gym. His AlC on 11/25/11 was 8.6%.  He is currently beginning to level off on his Lantus dose.  His goal is to lose more weight and to get/keep his Lantus dose low.  His goal is to be at 200 lb.  HYPERGLYCEMIA:  Gives no S/S for high blood glucose.  HYPOGLYCEMIA:  Notes that on on evening past taking his Lantus, he experienced a period when he had a headache.  He ate some PB and this relieved his symptoms.  BLOOD GLUCOSE MONITORING.  Fasting:  Ranges 127-109 mg/dl.                                                               Bedtime: [id number]      After Supper: 107-94-127      After Breakfast: 118, 132     MEDICATIONS: Completed medication review.  DIETARY INTAKE:  24-hr recall: 7:00 gym  B (8:30 AM): Bowl of cereal with milk and sometimes a banana.  Snk ( AM) :None  L (2:00 PM): a sub or sandwich (12 inch or most times the 6 inch) water or diet soda.  +/- small bag of chips.  Snk ( PM): None D (6:30-7:00 PM): grill chicken, black  Bean, rice, peppers, and 1/2 and 1/2 tea.  Chili[employer] restaruant Snk (9:00-10:00 PM): 2-3 PB crackers Beverages: water, 1/2 tea,    Recent physical activity: Does 5 days per week doing the eliptical, doing the treadmill, and the bike.  This is for about 50 minutes.  Estimated energy needs:HT:  74 in  WT: 260.5 lb  BMI: 33.5 kg/m2  Adj WT:217 lb  (98.5 kg) 1700-1800 calories 195-200 g carbohydrates 130-135 g protein 46-49 g fat  Progress  Towards Goal(s):  In progress.   Nutritional Diagnosis:  [place]-2.1 Inpaired nutrition utilization As related to blood glucose.  As evidenced by diagnosis of type 2 diabetes with an A1C of 8.6% and Lantus insulin for controlling blood glucose..    Intervention:  Nutrition Review of current intake and how to use the food labels to assist with carb control as well as portion control.  He verbalizes a desire to go on a vegetarian diet after the first of next year.  He has made no final decision but is exploring the topic.  Encouraged him to read closely and to be open to using a number of the non-starchy vegetables and to be ready to use the more complex grains that would contain more protein which would add to protein intake and make them go to glucose more slowly.  Handouts given during visit include:  Had lost previous handouts.  Living Well with Diabetes  Menu suggestions for 45 and 60 CHO gm Meals  Snack list  Non-starchy vegetable  list  Monitoring/Evaluation:  Dietary intake, exercise, blood glucose levels, and body weight in the next 3 months and to call if questions occur.Marland Kitchen

## 2011-12-31 NOTE — Patient Instructions (Addendum)
   Ask your MD for a prescription for the 3/16 inch needles for injecting into the leg/femoral area.  Continue with your regular exercise.  Continue to check your blood glucose.  Keep an eye on yourself.  Continue to check an after meal.  Experiment: check your blood glucose 2 hours after the first bite of your Thanksgiving meal.  Goal: 80-160 mg. If you are high, drink 16 oz of water and go for exercise.  Do your research on the vegetarian diet.  Plan to use the grains with more fiber and protein (brown rice, amaranth, Quinoa), tend to go to glucose slower and less likely to spike the blood glucose.

## 2012-01-11 ENCOUNTER — Encounter: Payer: Self-pay | Admitting: Dietician

## 2012-01-15 ENCOUNTER — Other Ambulatory Visit (INDEPENDENT_AMBULATORY_CARE_PROVIDER_SITE_OTHER): Payer: BC Managed Care – PPO

## 2012-01-15 DIAGNOSIS — E119 Type 2 diabetes mellitus without complications: Secondary | ICD-10-CM

## 2012-01-20 ENCOUNTER — Encounter: Payer: Self-pay | Admitting: Family Medicine

## 2012-01-20 ENCOUNTER — Ambulatory Visit (INDEPENDENT_AMBULATORY_CARE_PROVIDER_SITE_OTHER): Payer: BC Managed Care – PPO | Admitting: Family Medicine

## 2012-01-20 VITALS — BP 122/80 | HR 68 | Temp 97.8°F | Wt 262.0 lb

## 2012-01-20 DIAGNOSIS — E119 Type 2 diabetes mellitus without complications: Secondary | ICD-10-CM

## 2012-01-20 MED ORDER — INSULIN GLARGINE 100 UNIT/ML ~~LOC~~ SOLN: SUBCUTANEOUS | Status: DC

## 2012-01-20 NOTE — Assessment & Plan Note (Signed)
Much improved A1c, continue lantus with titration if needed, 1 unit per day if sugar <80 or >120. Continue diet and exercise. Recheck in about 4 months.  He agrees.

## 2012-01-20 NOTE — Progress Notes (Signed)
Diabetes:  Using medications without difficulties:yes Hypoglycemic episodes:no Hyperglycemic episodes:no Feet problems: pain from posterior R heel pain. Taking meloxicam for pain per ortho.  He has heel pads.  Blood Sugars averaging: 100-120 frequently in AM Exercising most days of the week.   A1c much improved, 6.9.  He's trying to work on a vegetarian diet.    PMH and SH reviewed  Meds, vitals, and allergies reviewed.   ROS: See HPI.  Otherwise negative.    GEN: nad, alert and oriented HEENT: mucous membranes moist NECK: supple w/o LA CV: rrr. PULM: ctab, no inc wob ABD: soft, +bs EXT: no edema SKIN: no acute rash  Diabetic foot exam: Normal inspection No skin breakdown Calluses noted on medial portion of 1st toes B Normal DP pulses Normal sensation to light touch and monofilament Nails normal

## 2012-01-20 NOTE — Patient Instructions (Signed)
Recheck your A1c in about 4 months before a visit.  Take care.  Thanks for your effort.

## 2012-03-29 ENCOUNTER — Encounter: Payer: Self-pay | Admitting: Internal Medicine

## 2012-04-14 ENCOUNTER — Ambulatory Visit (INDEPENDENT_AMBULATORY_CARE_PROVIDER_SITE_OTHER): Payer: BC Managed Care – PPO | Admitting: Internal Medicine

## 2012-04-14 ENCOUNTER — Encounter: Payer: Self-pay | Admitting: Internal Medicine

## 2012-04-14 VITALS — BP 140/80 | HR 55 | Ht 75.0 in | Wt 257.0 lb

## 2012-04-14 DIAGNOSIS — B182 Chronic viral hepatitis C: Secondary | ICD-10-CM

## 2012-04-14 DIAGNOSIS — K7460 Unspecified cirrhosis of liver: Secondary | ICD-10-CM

## 2012-04-14 NOTE — Patient Instructions (Addendum)
When you see Dr. Para March remind him you need a pneumonia booster vaccine.  Keep up the good work with your weight loss and exercising goals.  We will put a recall in our system for March 2015 for an EGD and Colonoscopy.  Thank you for choosing me and [facility].  Iva Boop, M.D., [facility]

## 2012-04-14 NOTE — Progress Notes (Signed)
  Subjective:    Patient ID: Alexander [name], male    DOB: 11-03-1950, 62 y.o.   MRN: [id number]  HPI The patient presents in follow-up at my request re: screening for esophageal varices in setting of HCV cirrhosis. He has not had varices and did not on last EGD 2012. Since the [facility] HCV clinic shut down and moved he has been followed at [facility] - records reviewed via care everywhere. He is doing well - losing weight and exercising in hope of stopping insulin for DM.    Review of Systems As above    Objective:   Physical Exam WDWN NAD       Assessment & Plan:  Cirrhosis of liver - compensated Child's A  Hepatitis C, chronic - genotype 1B s/p failed response to IFN ribavirin x 2  1. Hold off on repeat EGD to screen for varices 1 more year is ok 2. Will do with repeat screening colonoscopy in 04/2013 3. He needs a pneumococcal vaccine booster - he will ask Dr. Para March 4. Continue hepatitis f/u at [facility] for Tx again w/ newer Tx  [id number] Para March, MD and Dr. Wyvonnia Lora

## 2012-05-05 ENCOUNTER — Ambulatory Visit (INDEPENDENT_AMBULATORY_CARE_PROVIDER_SITE_OTHER): Payer: BC Managed Care – PPO | Admitting: Family Medicine

## 2012-05-05 ENCOUNTER — Encounter: Payer: Self-pay | Admitting: Family Medicine

## 2012-05-05 VITALS — BP 120/70 | HR 60 | Temp 97.7°F | Wt 251.0 lb

## 2012-05-05 DIAGNOSIS — R319 Hematuria, unspecified: Secondary | ICD-10-CM | POA: Insufficient documentation

## 2012-05-05 LAB — POCT URINALYSIS DIPSTICK
Ketones, UA: NEGATIVE
Leukocytes, UA: NEGATIVE
Protein, UA: NEGATIVE
Urobilinogen, UA: NEGATIVE
pH, UA: 6.5

## 2012-05-05 LAB — PROTIME-INR: Prothrombin Time: 14.2 s — ABNORMAL HIGH (ref 10.2–12.4)

## 2012-05-05 NOTE — Assessment & Plan Note (Signed)
New problem.  Former smoker, on ASA and h/o thrombocytopenia.  Check INR and CBC today along with mico and UCX.  Blood on u/a again today in office.  Likely refer to uro when labs resulted.  D/w pt.  Unremarkable exam. Anatomy and ddx d/w pt.  He agrees. >25 min spent with face to face with patient, >50% counseling and/or coordinating care.

## 2012-05-05 NOTE — Patient Instructions (Addendum)
Go to the lab on the way out.  We'll contact you with your lab report.  Cut out the insulin and if your sugar continues to run low then cut back on the precose.  We'll be in touch after I see your labs.   Take care.

## 2012-05-05 NOTE — Progress Notes (Signed)
Taking aspirin 81mg  a day.  Saw blood in urine yesterday. This was the first time he had seen it.  Former smoker.  No pain or changes with urine other than the blood in urine.  Feels well o/w. Still exercising a few times a week, 1 hour at a time, 4 times a week.    DM2. He's down to 4 units of insulin a night with his intentional weight loss.  His sugar has been 100-120 recently so it would be reasonable to stop the insulin for now- discussed.  He's lost 20 lbs intentionally over the last 2 years with diet and exercise.    Meds, vitals, and allergies reviewed.   ROS: See HPI.  Otherwise, noncontributory.  nad ncat rrr ctab abd soft, suprapubic area not ttp Testes bilaterally descended without nodularity, tenderness or masses. No scrotal masses or lesions. No penis lesions or urethral discharge.

## 2012-05-06 LAB — CBC WITH DIFFERENTIAL/PLATELET
Basophils Relative: 0.6 % (ref 0.0–3.0)
Eosinophils Absolute: 0.1 10*3/uL (ref 0.0–0.7)
Eosinophils Relative: 1.3 % (ref 0.0–5.0)
HCT: 43.8 % (ref 39.0–52.0)
Lymphs Abs: 2 10*3/uL (ref 0.7–4.0)
MCHC: 32.3 g/dL (ref 30.0–36.0)
MCV: 79.5 fl (ref 78.0–100.0)
Monocytes Absolute: 0.6 10*3/uL (ref 0.1–1.0)
Platelets: 139 10*3/uL — ABNORMAL LOW (ref 150.0–400.0)
WBC: 4.7 10*3/uL (ref 4.5–10.5)

## 2012-05-06 LAB — URINALYSIS, MICROSCOPIC ONLY
Bacteria, UA: NONE SEEN
Casts: NONE SEEN

## 2012-05-07 ENCOUNTER — Other Ambulatory Visit: Payer: Self-pay | Admitting: Family Medicine

## 2012-05-07 ENCOUNTER — Telehealth: Payer: Self-pay | Admitting: *Deleted

## 2012-05-07 DIAGNOSIS — R319 Hematuria, unspecified: Secondary | ICD-10-CM

## 2012-05-07 LAB — URINE CULTURE: Colony Count: NO GROWTH

## 2012-05-07 NOTE — Telephone Encounter (Signed)
Patient says he has cut back on his Precose from 2 tabs TID to 1 tab TID and his BS are still good.

## 2012-05-09 MED ORDER — ACARBOSE 25 MG PO TABS: ORAL_TABLET | ORAL | Status: DC

## 2012-05-09 NOTE — Telephone Encounter (Signed)
Noted.  Med list adjusted.

## 2012-05-18 ENCOUNTER — Other Ambulatory Visit: Payer: BC Managed Care – PPO

## 2012-05-25 ENCOUNTER — Encounter: Payer: Self-pay | Admitting: Family Medicine

## 2012-05-25 ENCOUNTER — Ambulatory Visit (INDEPENDENT_AMBULATORY_CARE_PROVIDER_SITE_OTHER): Payer: BC Managed Care – PPO | Admitting: Family Medicine

## 2012-05-25 VITALS — BP 124/84 | HR 63 | Temp 97.7°F | Wt 249.5 lb

## 2012-05-25 DIAGNOSIS — I10 Essential (primary) hypertension: Secondary | ICD-10-CM

## 2012-05-25 DIAGNOSIS — E119 Type 2 diabetes mellitus without complications: Secondary | ICD-10-CM

## 2012-05-25 LAB — LIPID PANEL
HDL: 29.5 mg/dL — ABNORMAL LOW (ref >39.00)
Total CHOL/HDL Ratio: 6
VLDL: 14.2 mg/dL (ref 0.0–40.0)

## 2012-05-25 LAB — COMPREHENSIVE METABOLIC PANEL
ALT: 56 U/L — ABNORMAL HIGH (ref 0–53)
AST: 54 U/L — ABNORMAL HIGH (ref 0–37)
Creatinine, Ser: 0.8 mg/dL (ref 0.4–1.5)
Total Bilirubin: 0.6 mg/dL (ref 0.3–1.2)

## 2012-05-25 LAB — HEMOGLOBIN A1C: Hgb A1c MFr Bld: 6.7 % — ABNORMAL HIGH (ref 4.6–6.5)

## 2012-05-25 NOTE — Patient Instructions (Addendum)
Go to the lab on the way out.  We'll contact you with your lab report. Assuming your labs are good, then we should recheck in about 6 months.  If your A1c is really low, we can cut back your meds.    Take care.

## 2012-05-25 NOTE — Addendum Note (Signed)
Addended by: Alvina Chou on: 05/25/2012 08:43 AM   Modules accepted: Orders

## 2012-05-25 NOTE — Assessment & Plan Note (Signed)
Check A1c today, see notes on labs.  Home sugars are good, with dec in acarbose.  He is working on diet and exercise.

## 2012-05-25 NOTE — Progress Notes (Signed)
He has f/u with uro pending. He hasn't seen any more bloody urine.   Diabetes:  Using medications without difficulties: was able to cut down on acarbose.  Hypoglycemic episodes:no Hyperglycemic episodes:no Feet problems:no Blood Sugars averaging: ~100-110 He's working hard on his diet and exercising 3-4 times a week.   Work is going well.  It is a stressful work situation, but he does well.   He has f/u with [facility] hep clinic in 5/14.   Meds, vitals, and allergies reviewed.   ROS: See HPI.  Otherwise negative.    GEN: nad, alert and oriented NECK: supple w/o LA CV: rrr. PULM: ctab, no inc wob ABD: soft, +bs EXT: no edema SKIN: no acute rash  Diabetic foot exam: Normal inspection No skin breakdown No calluses  Normal DP pulses Normal sensation to light touch and monofilament Nails normal

## 2012-05-26 NOTE — Addendum Note (Signed)
Addended by: Joaquim Nam on: 05/26/2012 01:48 PM   Modules accepted: Orders, Medications

## 2012-06-10 ENCOUNTER — Encounter: Payer: Self-pay | Admitting: Family Medicine

## 2012-06-10 ENCOUNTER — Other Ambulatory Visit: Payer: Self-pay | Admitting: Family Medicine

## 2012-06-15 ENCOUNTER — Telehealth: Payer: Self-pay

## 2012-06-15 NOTE — Telephone Encounter (Signed)
Patient advised.

## 2012-06-15 NOTE — Telephone Encounter (Signed)
Pt saw Dr Horton Chin in [place] and discussed BP and meds pt taking for BP control; pt taking amlodipine 10 mg one daily,Diovan HCTZ 320-25 mg one daily,Toprol XL 100 mg one daily and Lisinopril 40 mg one daily.  Pt said has not taken Lisinopril for 1 week and BP between 120/75 - 135/80. Pt said has been exercising and losing weight and did not know if needs to take 4 BP meds. Pt has no H/A,dizziness,CP or SOB. [facility] [address]Please advise.

## 2012-06-15 NOTE — Telephone Encounter (Signed)
If his pressures are in that range, then continue as is off the lisinopril.  I updated the med list.  If he gets low BP as he continues to lose weight, then the next step would be cutting the amlodipine in half, down to 5mg  a day.  Thanks.

## 2012-06-17 ENCOUNTER — Other Ambulatory Visit: Payer: Self-pay | Admitting: Family Medicine

## 2012-06-24 ENCOUNTER — Encounter: Payer: Self-pay | Admitting: Family Medicine

## 2012-09-23 ENCOUNTER — Other Ambulatory Visit: Payer: Self-pay | Admitting: Family Medicine

## 2012-10-26 ENCOUNTER — Telehealth: Payer: Self-pay

## 2012-10-26 ENCOUNTER — Encounter: Payer: Self-pay | Admitting: Family Medicine

## 2012-10-26 ENCOUNTER — Ambulatory Visit (INDEPENDENT_AMBULATORY_CARE_PROVIDER_SITE_OTHER): Payer: BC Managed Care – PPO | Admitting: Family Medicine

## 2012-10-26 VITALS — BP 140/90 | HR 57 | Temp 97.8°F | Wt 262.5 lb

## 2012-10-26 DIAGNOSIS — I10 Essential (primary) hypertension: Secondary | ICD-10-CM

## 2012-10-26 MED ORDER — AMLODIPINE BESYLATE 10 MG PO TABS: 5.0000 mg | ORAL_TABLET | Freq: Every day | ORAL | Status: DC

## 2012-10-26 NOTE — Assessment & Plan Note (Signed)
Continue diet and exercise, he can try cutting the amlodipine to 5mg  a day in the meantime.  Recheck BP at work, out of clinic.  Call back as needed.  He agrees.  If BP is up, restart the 10mg  dose.

## 2012-10-26 NOTE — Telephone Encounter (Signed)
Seeing today. See OV note.

## 2012-10-26 NOTE — Progress Notes (Signed)
He just finished the HCV treatment at [facility].  He was on it for 12 weeks. He feels good.  The only ADE were HA and light sensitivity.  This was much more tolerable to interferon.  He has a f/u with [facility] pending.    He has been having some lower BP readings at the [facility] clinic.  He had been going to the gym and working on weight/diet. The waist on his pants is looser.  Last BP at [facility] was 120/62.  It had trended down to that.  Slightly lightheaded occ.  No CP, not SOB.  Scant edema in the feet, resolved with elevation.    Meds, vitals, and allergies reviewed.   ROS: See HPI.  Otherwise, noncontributory.  GEN: nad, alert and oriented HEENT: mucous membranes moist NECK: supple w/o LA CV: rrr PULM: ctab, no inc wob ABD: soft, +bs EXT: trace BLE edema No orthostatic sx on standing.

## 2012-10-26 NOTE — Telephone Encounter (Signed)
Pt's BP today 120/62; last few months pts BP is 120/70-80. Pt is on several BP meds and wants to know if should change dosage or stop any BP meds for now. Pt has slight h/a. No dizziness,SOB or CP. Pt has already scheduled appt for today at 12:15 pm but does not want to keep appt if Dr Para March can advise over phone. Pt request cb. [facility] [address]

## 2012-10-26 NOTE — Patient Instructions (Addendum)
You can try cutting the amlodipine to 5 mg a day.  See if that helps.  Please check your BP a few times in the near future.  If the swelling gets worse, then let me know.  Tell you wife I said hello.  Take care.

## 2012-11-25 ENCOUNTER — Ambulatory Visit (INDEPENDENT_AMBULATORY_CARE_PROVIDER_SITE_OTHER): Payer: BC Managed Care – PPO | Admitting: Family Medicine

## 2012-11-25 ENCOUNTER — Encounter: Payer: Self-pay | Admitting: Family Medicine

## 2012-11-25 VITALS — BP 126/80 | HR 72 | Temp 97.7°F

## 2012-11-25 DIAGNOSIS — E119 Type 2 diabetes mellitus without complications: Secondary | ICD-10-CM

## 2012-11-25 NOTE — Patient Instructions (Signed)
Go to the lab on the way out.  We'll contact you with your lab report. Take care.  Recheck in about 6 months at a physical.  Glad to see you.

## 2012-11-25 NOTE — Assessment & Plan Note (Signed)
See notes on labs.  Flu shot done at work.  Working on diet and weight.  Continue current meds as is.  Appears to be doing well.

## 2012-11-25 NOTE — Progress Notes (Signed)
Diabetes:  Using medications without difficulties:yes Hypoglycemic episodes:no Hyperglycemic episodes:no Feet problems:no Blood Sugars averaging: ~130s, lower the day after exercise.  eye exam within last year:no, due, has f/u pending.  He's been in the gym a lot, frequently exercising.  He's off acarbose and GI sx resolved in the meantime.    He has been able to lower the norvasc dose if is BP was running low. He has generally been taking 10mg  a day.  BP is also lower the day after exercise.  No swelling usually, other than minimal amount in the ankles occ.    Skin lesion noted on L anterior chest.  Noted by wife recently.  Unknown duration.    His work is going well, but the work isn't easy.    PMH and SH reviewed  Meds, vitals, and allergies reviewed.   ROS: See HPI.  Otherwise negative.    GEN: nad, alert and oriented HEENT: mucous membranes moist NECK: supple w/o LA CV: rrr. PULM: ctab, no inc wob ABD: soft, +bs EXT: no edema SKIN: no acute rash but 4mm hypopigmented ring with normal pigmentation centrally.    Diabetic foot exam: Normal inspection No skin breakdown No calluses  Normal DP pulses Normal sensation to light touch and monofilament Nails normal

## 2012-11-26 ENCOUNTER — Encounter: Payer: Self-pay | Admitting: *Deleted

## 2012-11-29 ENCOUNTER — Telehealth: Payer: Self-pay | Admitting: Family Medicine

## 2012-11-29 NOTE — Telephone Encounter (Signed)
Message copied by Joaquim Nam on Mon Nov 29, 2012  5:09 PM ------      Message from: Bjorn Pippin      Created: Mon Nov 29, 2012  8:52 AM       Thanks,                Unfortunately the complex cyst was incompletely evaulated on the MRI.  I may get an renal US when he sees me just to check the size.                   ----- Message -----         From: Joaquim Nam, MD         Sent: 11/25/2012   2:07 PM           To: Bjorn Pippin, MD            FYI.  Saw pt in clinic today.  Pt recently had MRI done at [facility].  You should be able to get it under 'care everywhere' in the EMR.  This may (or may not) prevent an extra scan.             Thanks for seeing this patient.             Raechel Ache       ------

## 2013-03-21 ENCOUNTER — Ambulatory Visit (INDEPENDENT_AMBULATORY_CARE_PROVIDER_SITE_OTHER): Payer: BC Managed Care – PPO | Admitting: Family Medicine

## 2013-03-21 ENCOUNTER — Encounter: Payer: Self-pay | Admitting: Family Medicine

## 2013-03-21 VITALS — BP 128/86 | HR 64 | Temp 97.7°F | Wt 260.5 lb

## 2013-03-21 DIAGNOSIS — M109 Gout, unspecified: Secondary | ICD-10-CM

## 2013-03-21 LAB — URIC ACID: URIC ACID, SERUM: 8.5 mg/dL — AB (ref 4.0–7.8)

## 2013-03-21 MED ORDER — OXYCODONE HCL 5 MG PO TABS: 2.5000 mg | ORAL_TABLET | Freq: Four times a day (QID) | ORAL | Status: DC | PRN

## 2013-03-21 NOTE — Patient Instructions (Signed)
Use the oxycodone for pain for now.  Take it sparingly.  It can make you drowsy.  Go to the lab on the way out.  We'll contact you with your lab report. Check the gout diet.  If you have another flare, restart the oxycodone and notify me.  Take care.  Glad to see you.

## 2013-03-21 NOTE — Progress Notes (Signed)
Pre-visit discussion using our clinic review tool. No additional management support is needed unless otherwise documented below in the visit note.  His wife has a PET scan pending.    He had the new treatment for HCV and had neg f/u labs per his report. He has f/u with [facility] pending. They may release him at that point.    L 1st MTP pain.  No trauma. He did wear some tight loafers recently.  Then had some sausage. The next day had the pain.  Some better now; he iced it. Red and swollen.  Pain walking. Less pain if not weight bearing. No h/o gout.  No fevers. He elevated his foot in the meantime and drank cherry juice.   Meds, vitals, and allergies reviewed.   ROS: See HPI.  Otherwise, noncontributory.  nad Pain with walking L foot with normal inspection except for L 1st MTP pain/swelling/erythema.

## 2013-03-22 DIAGNOSIS — M109 Gout, unspecified: Secondary | ICD-10-CM | POA: Insufficient documentation

## 2013-03-22 NOTE — Assessment & Plan Note (Signed)
Likely, avoid nsaids and prednisone for now.  Oxycodone for pain.  Handout given. See notes on labs.  May have to stop HCTZ.  Likely first episode.  D/w pt.

## 2013-04-12 ENCOUNTER — Other Ambulatory Visit: Payer: Self-pay | Admitting: Family Medicine

## 2013-04-14 ENCOUNTER — Encounter: Payer: Self-pay | Admitting: Internal Medicine

## 2013-04-19 ENCOUNTER — Encounter: Payer: Self-pay | Admitting: Internal Medicine

## 2013-05-11 ENCOUNTER — Other Ambulatory Visit: Payer: Self-pay | Admitting: Family Medicine

## 2013-05-11 NOTE — Telephone Encounter (Signed)
Would continue as prev documented.   Sent.

## 2013-05-11 NOTE — Telephone Encounter (Signed)
Received refill request from pharmacy. Request does not match medication sheet. Last office visit 03/21/13. Is it okay to refill medication?

## 2013-05-19 ENCOUNTER — Other Ambulatory Visit: Payer: BC Managed Care – PPO

## 2013-05-24 ENCOUNTER — Encounter: Payer: Self-pay | Admitting: *Deleted

## 2013-05-24 ENCOUNTER — Other Ambulatory Visit (INDEPENDENT_AMBULATORY_CARE_PROVIDER_SITE_OTHER): Payer: BC Managed Care – PPO

## 2013-05-24 DIAGNOSIS — E119 Type 2 diabetes mellitus without complications: Secondary | ICD-10-CM

## 2013-05-24 LAB — HEMOGLOBIN A1C: Hgb A1c MFr Bld: 7.4 % — ABNORMAL HIGH (ref 4.6–6.5)

## 2013-05-26 ENCOUNTER — Ambulatory Visit (INDEPENDENT_AMBULATORY_CARE_PROVIDER_SITE_OTHER): Payer: BC Managed Care – PPO | Admitting: Family Medicine

## 2013-05-26 ENCOUNTER — Encounter: Payer: Self-pay | Admitting: Family Medicine

## 2013-05-26 VITALS — BP 122/78 | HR 65 | Temp 97.7°F | Ht 75.5 in | Wt 260.5 lb

## 2013-05-26 DIAGNOSIS — Z23 Encounter for immunization: Secondary | ICD-10-CM

## 2013-05-26 DIAGNOSIS — M109 Gout, unspecified: Secondary | ICD-10-CM

## 2013-05-26 DIAGNOSIS — I10 Essential (primary) hypertension: Secondary | ICD-10-CM

## 2013-05-26 DIAGNOSIS — E119 Type 2 diabetes mellitus without complications: Secondary | ICD-10-CM

## 2013-05-26 DIAGNOSIS — Z0000 Encounter for general adult medical examination without abnormal findings: Secondary | ICD-10-CM

## 2013-05-26 MED ORDER — GLUCOSE BLOOD VI STRP: ORAL_STRIP | Status: DC

## 2013-05-26 NOTE — Patient Instructions (Addendum)
Ask [facility] if they are okay with you getting the shingles shot.  Take care. Glad to see you.  Recheck A1c in about 6 months before a visit.   Glad to see you.

## 2013-05-26 NOTE — Progress Notes (Signed)
Pre visit review using our clinic review tool, if applicable. No additional management support is needed unless otherwise documented below in the visit note.  CPE- See plan.  Routine anticipatory guidance given to patient.  See health maintenance. Tetanus 2012 Flu done at work  Shingles- he'll check with [place].  PNA shot today.  Colonoscopy pending with GI.  Prostate cancer screening and PSA options (with potential risks and benefits of testing vs not testing) were discussed along with recent recs/guidelines.  He declined testing PSA at this point. Living will d/w pt.  He's working on this.  Wife designated if patient were incapacitated.   Diet and exercise d/w pt.  Gout set him back.  His toe is better but his thumb pain continues B.    Gout.  No recent oxycodone use.  He has it if he needs it.  Single flare prev.  Discussed options.  Uric acid prev up.  D/w pt.  Deferred proph meds for now.  He doesn't want extra meds.   Diabetes:  Using medications without difficulties:yes Hypoglycemic episodes:no Hyperglycemic episodes: no Feet problems: no Blood Sugars averaging: ~120-140s eye exam within last year: f/u pending.   Hypertension:    Using medication without problems or lightheadedness: yes Chest pain with exertion:no Edema:no Short of breath:no Average home NIO:[id number]. Alternating 5 and 10 mg amlodipine daily.    PMH and SH reviewed.   Vital signs, Meds and allergies reviewed.  ROS: See HPI.  Otherwise nontributory.   GEN: nad, alert and oriented HEENT: mucous membranes moist NECK: supple w/o LA CV: rrr PULM: ctab, no inc wob ABD: soft, +bs EXT: no edema SKIN: no acute rash  Diabetic foot exam: Normal inspection No skin breakdown No calluses  Normal DP pulses Normal sensation to light tough and monofilament Nails normal

## 2013-05-27 NOTE — Assessment & Plan Note (Signed)
Controlled with alternating 5/10mg  amlodipine.  Continue as is.  He agrees.

## 2013-05-27 NOTE — Assessment & Plan Note (Signed)
Routine anticipatory guidance given to patient.  See health maintenance. Tetanus 2012 Flu done at work  Shingles- he'll check with [place].  PNA shot today.  Colonoscopy pending with GI.  Prostate cancer screening and PSA options (with potential risks and benefits of testing vs not testing) were discussed along with recent recs/guidelines.  He declined testing PSA at this point. Living will d/w pt.  He's working on this.  Wife designated if patient were incapacitated.   Diet and exercise d/w pt.  Gout set him back.  His toe is better but his thumb pain continues B.

## 2013-05-27 NOTE — Assessment & Plan Note (Signed)
No med changes, he'll work on diet and exercise in the meantime, recheck later in 2015.  He agrees.

## 2013-05-27 NOTE — Assessment & Plan Note (Signed)
No recent oxycodone use. He has it if he needs it. Single flare prev. Discussed options. Uric acid prev up. D/w pt. Deferred proph meds for now. He doesn't want extra meds.

## 2013-06-03 ENCOUNTER — Ambulatory Visit (AMBULATORY_SURGERY_CENTER): Payer: Self-pay

## 2013-06-03 VITALS — Ht 75.75 in | Wt 264.4 lb

## 2013-06-03 DIAGNOSIS — Z1211 Encounter for screening for malignant neoplasm of colon: Secondary | ICD-10-CM

## 2013-06-03 MED ORDER — SUPREP BOWEL PREP KIT 17.5-3.13-1.6 GM/177ML PO SOLN: 1.0000 | Freq: Once | ORAL | Status: DC

## 2013-06-03 NOTE — Progress Notes (Signed)
No allergies to eggs or soy. No home oxygen. No diet/weight loss meds. Has email.  Emmi instructions given for colon and egd.

## 2013-06-08 ENCOUNTER — Telehealth: Payer: Self-pay

## 2013-06-08 ENCOUNTER — Encounter: Payer: Self-pay | Admitting: Internal Medicine

## 2013-06-08 NOTE — Telephone Encounter (Signed)
Relevant patient education assigned to patient using Emmi. ° °

## 2013-06-15 ENCOUNTER — Other Ambulatory Visit: Payer: Self-pay | Admitting: Family Medicine

## 2013-06-15 NOTE — Telephone Encounter (Signed)
I thought he had been on 1.5 a day of the metoprolol.  Please verify with patient- I sent it in for now.  Thanks.

## 2013-06-15 NOTE — Telephone Encounter (Signed)
Received refill request electronically. Refill request for Metoprolol does not match the medication list which shows one a day.

## 2013-06-16 NOTE — Telephone Encounter (Signed)
Thanks

## 2013-06-16 NOTE — Telephone Encounter (Signed)
Patient says he was on Metoprolol 100 mg, one and a half tablets at one time but after his weight loss, he was able to trim down to 1 a day.  I told him that the Rx has already been sent in for one and a half tablets but that he should continue taking it as he has recently and we will adjust his meds list to state 1 per day.

## 2013-06-17 ENCOUNTER — Encounter: Payer: BC Managed Care – PPO | Admitting: Internal Medicine

## 2013-06-19 ENCOUNTER — Encounter: Payer: Self-pay | Admitting: Family Medicine

## 2013-06-24 ENCOUNTER — Other Ambulatory Visit: Payer: Self-pay | Admitting: Family Medicine

## 2013-07-01 ENCOUNTER — Encounter: Payer: Self-pay | Admitting: Family Medicine

## 2013-07-19 ENCOUNTER — Ambulatory Visit (AMBULATORY_SURGERY_CENTER): Payer: BC Managed Care – PPO | Admitting: Internal Medicine

## 2013-07-19 ENCOUNTER — Encounter: Payer: Self-pay | Admitting: Internal Medicine

## 2013-07-19 ENCOUNTER — Telehealth: Payer: Self-pay | Admitting: Internal Medicine

## 2013-07-19 VITALS — BP 136/81 | HR 59 | Temp 96.9°F | Resp 18 | Ht 75.75 in | Wt 264.0 lb

## 2013-07-19 DIAGNOSIS — K7460 Unspecified cirrhosis of liver: Secondary | ICD-10-CM

## 2013-07-19 DIAGNOSIS — D126 Benign neoplasm of colon, unspecified: Secondary | ICD-10-CM

## 2013-07-19 DIAGNOSIS — K2960 Other gastritis without bleeding: Secondary | ICD-10-CM

## 2013-07-19 DIAGNOSIS — Z1211 Encounter for screening for malignant neoplasm of colon: Secondary | ICD-10-CM

## 2013-07-19 LAB — GLUCOSE, CAPILLARY
Glucose-Capillary: 118 mg/dL — ABNORMAL HIGH (ref 70–99)
Glucose-Capillary: 106 mg/dL — ABNORMAL HIGH (ref 70–99)

## 2013-07-19 MED ORDER — SODIUM CHLORIDE 0.9 % IV SOLN: 500.0000 mL | INTRAVENOUS | Status: DC

## 2013-07-19 NOTE — Telephone Encounter (Signed)
Patient is scheduled for [place] at [facility] for 07/22/13 8:30 Left message for patient to call back

## 2013-07-19 NOTE — Progress Notes (Signed)
Pt stable to RR 

## 2013-07-19 NOTE — Telephone Encounter (Signed)
Patient has been released from [facility] after cure of HCV. He has cirrhosis and needs US liver (RUQ limited every 6 months).  Please arrange one for him in [place]

## 2013-07-19 NOTE — Progress Notes (Signed)
Called to room to assist during endoscopic procedure.  Patient ID and intended procedure confirmed with present staff. Received instructions for my participation in the procedure from the performing physician.  

## 2013-07-19 NOTE — Progress Notes (Signed)
There was not a diagnosis given for the EGD so it was not put in the smart sets.

## 2013-07-19 NOTE — Op Note (Signed)
[place]  [address] [place], [id number]   ENDOSCOPY PROCEDURE REPORT  PATIENT: Alexander [name], Alexander [name]  MR#: [id number] BIRTHDATE: 1950/08/15 , 62  yrs. old GENDER: Male ENDOSCOPIST: Gatha Mayer, MD, [facility] PROCEDURE DATE:  07/19/2013 PROCEDURE:  EGD w/ biopsy for H.pylori ASA CLASS:     Class III INDICATIONS:  screening for varices in cirrhosis. MEDICATIONS: Propofol (Diprivan) 120 mg IV, MAC sedation, administered by CRNA, and These medications were titrated to patient response per physician's verbal order TOPICAL ANESTHETIC: none  DESCRIPTION OF PROCEDURE: After the risks benefits and alternatives of the procedure were thoroughly explained, informed consent was obtained.  The LB GIF-H180 Loaner [id number] endoscope was introduced through the mouth and advanced to the second portion of the duodenum. Without limitations.  The instrument was slowly withdrawn as the mucosa was fully examined.    STOMACH: Moderate erosive gastritis (inflammation) was found in the cardia and gastric antrum.  Multiple biopsies were performed using cold forceps.  Sample obtained for helicobacter pylori testing using rapid urease test.  The remainder of the upper endoscopy exam was otherwise normal. Retroflexed views revealed no abnormalities.     The scope was then withdrawn from the patient and the procedure completed.  COMPLICATIONS: There were no complications. ENDOSCOPIC IMPRESSION: 1.   Erosive gastritis (inflammation) was found in the cardia and gastric antrum; multiple biopsies 2.   The remainder of the upper endoscopy exam was otherwise normal - NO VARICES OR PORTAL GASTROPATHY  RECOMMENDATIONS: Await biopsy results treat H. pylori if +, PPI if not, will check on NSAID use also Repeat EGD 2-3 years   eSigned:  Gatha Mayer, MD, [facility] 07/19/2013 2:51 PM   CC:The Patient, Melody Comas [facility]) and Elsie Stain, MD

## 2013-07-19 NOTE — Patient Instructions (Addendum)
You have multiple erosions (tiny ulcers) in the stomach. I took a biopsy to see if an infection is causing this and will let you know results and plans.  I found and removed 2 colon polyps that look benign.  You will probably need another routine colonoscopy in 5 years and another upper endoscopy in 2-3 years.  I will let you know pathology results and when to have another routine colonoscopy by mail.  I appreciate the opportunity to care for you. Gatha Mayer, MD, [facility]   Discharge instructions given with verbal understanding. Handouts on gastritis and polyps. Resume previous medications. YOU HAD AN ENDOSCOPIC PROCEDURE TODAY AT [facility]: Refer to the procedure report that was given to you for any specific questions about what was found during the examination.  If the procedure report does not answer your questions, please call your gastroenterologist to clarify.  If you requested that your care partner not be given the details of your procedure findings, then the procedure report has been included in a sealed envelope for you to review at your convenience later.  YOU SHOULD EXPECT: Some feelings of bloating in the abdomen. Passage of more gas than usual.  Walking can help get rid of the air that was put into your GI tract during the procedure and reduce the bloating. If you had a lower endoscopy (such as a colonoscopy or flexible sigmoidoscopy) you may notice spotting of blood in your stool or on the toilet paper. If you underwent a bowel prep for your procedure, then you may not have a normal bowel movement for a few days.  DIET: Your first meal following the procedure should be a light meal and then it is ok to progress to your normal diet.  A half-sandwich or bowl of soup is an example of a good first meal.  Heavy or fried foods are harder to digest and may make you feel nauseous or bloated.  Likewise meals heavy in dairy and vegetables can cause extra gas to form and  this can also increase the bloating.  Drink plenty of fluids but you should avoid alcoholic beverages for 24 hours.  ACTIVITY: Your care partner should take you home directly after the procedure.  You should plan to take it easy, moving slowly for the rest of the day.  You can resume normal activity the day after the procedure however you should NOT DRIVE or use heavy machinery for 24 hours (because of the sedation medicines used during the test).    SYMPTOMS TO REPORT IMMEDIATELY: A gastroenterologist can be reached at any hour.  During normal business hours, 8:30 AM to 5:00 PM Monday through Friday, call [phone].  After hours and on weekends, please call the GI answering service at [phone] who will take a message and have the physician on call contact you.   Following lower endoscopy (colonoscopy or flexible sigmoidoscopy):  Excessive amounts of blood in the stool  Significant tenderness or worsening of abdominal pains  Swelling of the abdomen that is new, acute  Fever of 100F or higher  Following upper endoscopy (EGD)  Vomiting of blood or coffee ground material  New chest pain or pain under the shoulder blades  Painful or persistently difficult swallowing  New shortness of breath  Fever of 100F or higher  Black, tarry-looking stools  FOLLOW UP: If any biopsies were taken you will be contacted by phone or by letter within the next 1-3 weeks.  Call your gastroenterologist  if you have not heard about the biopsies in 3 weeks.  Our staff will call the home number listed on your records the next business day following your procedure to check on you and address any questions or concerns that you may have at that time regarding the information given to you following your procedure. This is a courtesy call and so if there is no answer at the home number and we have not heard from you through the emergency physician on call, we will assume that you have returned to your regular  daily activities without incident.  SIGNATURES/CONFIDENTIALITY: You and/or your care partner have signed paperwork which will be entered into your electronic medical record.  These signatures attest to the fact that that the information above on your After Visit Summary has been reviewed and is understood.  Full responsibility of the confidentiality of this discharge information lies with you and/or your care-partner.

## 2013-07-19 NOTE — Op Note (Signed)
[place]  [address]. [place], [id number]   COLONOSCOPY PROCEDURE REPORT  PATIENT: Alexander [name], Alexander [name]  MR#: [id number] BIRTHDATE: 12/16/1950 , 62  yrs. old GENDER: Male ENDOSCOPIST: Gatha Mayer, MD, [facility] PROCEDURE DATE:  07/19/2013 PROCEDURE:   Colonoscopy with snare polypectomy First Screening Colonoscopy - Avg.  risk and is 50 yrs.  old or older - No.  Prior Negative Screening - Now for repeat screening. 10 or more years since last screening  History of Adenoma - Now for follow-up colonoscopy & has been > or = to 3 yrs.  N/A  Polyps Removed Today? Yes. ASA CLASS:   Class III INDICATIONS:average risk screening and Last colonoscopy performed 10 years ago. MEDICATIONS: There was residual sedation effect present from prior procedure, Propofol (Diprivan) 480 mg IV, MAC sedation, administered by CRNA, and These medications were titrated to patient response per physician's verbal order  DESCRIPTION OF PROCEDURE:   After the risks benefits and alternatives of the procedure were thoroughly explained, informed consent was obtained.  A digital rectal exam revealed no abnormalities of the rectum, A digital rectal exam revealed the prostate was not enlarged, and A digital rectal exam revealed no prostatic nodules.   The LB [id number]  endoscope was introduced through the anus and advanced to the cecum, which was identified by both the appendix and ileocecal valve. No adverse events experienced.   The quality of the prep was excellent using Suprep  The instrument was then slowly withdrawn as the colon was fully examined.  COLON FINDINGS: Two diminutive sessile polyps were found in the transverse colon.  A polypectomy was performed with a cold snare. The colon mucosa was otherwise normal.  Retroflexed views revealed no abnormalities. The time to cecum=5 minutes 54 seconds. Abdominal pressure used to assist with insertion. Withdrawal time=15 minutes 14 seconds.   The scope was withdrawn and the procedure completed. COMPLICATIONS: There were no complications.  ENDOSCOPIC IMPRESSION: 1.   Two diminutive sessile polyps were found in the transverse colon; polypectomy was performed with a cold snare 2.   The colon mucosa was otherwise normal - excellent prep - second screening  RECOMMENDATIONS: Timing of repeat colonoscopy will be determined by pathology findings.   eSigned:  Gatha Mayer, MD, [facility] 07/19/2013 2:55 PM   cc: The Patient, Melody Comas, MD and Elsie Stain, MD

## 2013-07-20 ENCOUNTER — Telehealth: Payer: Self-pay

## 2013-07-20 LAB — HELICOBACTER PYLORI SCREEN-BIOPSY: UREASE: NEGATIVE

## 2013-07-20 NOTE — Telephone Encounter (Signed)
  Follow up Call-  Call back number 07/19/2013  Post procedure Call Back phone  # [phone]  Permission to leave phone message Yes     Patient questions:  Do you have a fever, pain , or abdominal swelling? no Pain Score  0 *  Have you tolerated food without any problems? yes  Have you been able to return to your normal activities? yes  Do you have any questions about your discharge instructions: Diet   no Medications  no Follow up visit  no  Do you have questions or concerns about your Care? no  Actions: * If pain score is 4 or above: No action needed, pain <4.  No problems per the pt. maw

## 2013-07-20 NOTE — Telephone Encounter (Signed)
I spoke with the patient, he was  aware of the appt and he rescheduled the appointment for August

## 2013-07-21 ENCOUNTER — Other Ambulatory Visit: Payer: Self-pay

## 2013-07-21 MED ORDER — OMEPRAZOLE 20 MG PO CPDR: 20.0000 mg | DELAYED_RELEASE_CAPSULE | Freq: Every day | ORAL | Status: DC

## 2013-07-21 NOTE — Progress Notes (Signed)
Quick Note:  No H. Pylori Gastritis likely from ibuprofen  1) omeprazole 20 mg qd before breakfast x 2 months then take daily if taking ibuprofen #90 3 RF 2) [facility] no letter / recall from this ______

## 2013-07-22 ENCOUNTER — Ambulatory Visit (HOSPITAL_COMMUNITY): Payer: BC Managed Care – PPO

## 2013-07-26 ENCOUNTER — Encounter: Payer: Self-pay | Admitting: Internal Medicine

## 2013-07-26 NOTE — Progress Notes (Signed)
Quick Note:  Prolapse type polyps - not pre-cancerous Repeat screening colonoscopy 2025 ______

## 2013-09-19 ENCOUNTER — Other Ambulatory Visit: Payer: Self-pay | Admitting: Family Medicine

## 2013-09-22 ENCOUNTER — Ambulatory Visit (HOSPITAL_COMMUNITY): Payer: BC Managed Care – PPO

## 2013-11-02 ENCOUNTER — Ambulatory Visit (HOSPITAL_COMMUNITY): Payer: BC Managed Care – PPO

## 2013-11-10 ENCOUNTER — Other Ambulatory Visit: Payer: Self-pay | Admitting: Family Medicine

## 2013-11-10 DIAGNOSIS — E119 Type 2 diabetes mellitus without complications: Secondary | ICD-10-CM

## 2013-11-13 ENCOUNTER — Other Ambulatory Visit: Payer: Self-pay | Admitting: Family Medicine

## 2013-11-13 DIAGNOSIS — E119 Type 2 diabetes mellitus without complications: Secondary | ICD-10-CM

## 2013-11-18 ENCOUNTER — Other Ambulatory Visit (INDEPENDENT_AMBULATORY_CARE_PROVIDER_SITE_OTHER): Payer: BC Managed Care – PPO

## 2013-11-18 DIAGNOSIS — E119 Type 2 diabetes mellitus without complications: Secondary | ICD-10-CM

## 2013-11-18 LAB — LIPID PANEL
CHOL/HDL RATIO: 6
Cholesterol: 181 mg/dL (ref 0–200)
HDL: 28.6 mg/dL — ABNORMAL LOW (ref >39.00)
LDL Cholesterol: 138 mg/dL — ABNORMAL HIGH (ref 0–99)
NONHDL: 152.4
Triglycerides: 74 mg/dL (ref 0.0–149.0)
VLDL: 14.8 mg/dL (ref 0.0–40.0)

## 2013-11-18 LAB — HEMOGLOBIN A1C: HEMOGLOBIN A1C: 7.5 % — AB (ref 4.6–6.5)

## 2013-11-18 LAB — COMPREHENSIVE METABOLIC PANEL
ALBUMIN: 3.6 g/dL (ref 3.5–5.2)
ALT: 24 U/L (ref 0–53)
AST: 22 U/L (ref 0–37)
Alkaline Phosphatase: 42 U/L (ref 39–117)
BUN: 13 mg/dL (ref 6–23)
CALCIUM: 8.9 mg/dL (ref 8.4–10.5)
CHLORIDE: 100 meq/L (ref 96–112)
CO2: 27 mEq/L (ref 19–32)
Creatinine, Ser: 0.9 mg/dL (ref 0.4–1.5)
GFR: 115.59 mL/min (ref >60.00)
Glucose, Bld: 134 mg/dL — ABNORMAL HIGH (ref 70–99)
POTASSIUM: 3.8 meq/L (ref 3.5–5.1)
SODIUM: 137 meq/L (ref 135–145)
TOTAL PROTEIN: 7.9 g/dL (ref 6.0–8.3)
Total Bilirubin: 0.7 mg/dL (ref 0.2–1.2)

## 2013-11-25 ENCOUNTER — Ambulatory Visit (INDEPENDENT_AMBULATORY_CARE_PROVIDER_SITE_OTHER): Payer: BC Managed Care – PPO | Admitting: Family Medicine

## 2013-11-25 ENCOUNTER — Encounter: Payer: Self-pay | Admitting: Family Medicine

## 2013-11-25 VITALS — BP 120/80 | HR 58 | Temp 97.6°F | Wt 264.5 lb

## 2013-11-25 DIAGNOSIS — E119 Type 2 diabetes mellitus without complications: Secondary | ICD-10-CM

## 2013-11-25 NOTE — Patient Instructions (Signed)
Recheck in about 6 months at a physical.  Labs ahead of time.   Send me the papers about the [place] trip.  Take care.  Glad to see you.

## 2013-11-25 NOTE — Progress Notes (Signed)
Pre visit review using our clinic review tool, if applicable. No additional management support is needed unless otherwise documented below in the visit note.  Diabetes:  Using medications without difficulties:yes Hypoglycemic episodes:no Hyperglycemic episodes:no Feet problems:no Blood Sugars averaging: ~120-140 eye exam within last year: done last week.  A1c similar to prev.  D/w pt.   Flu shot done at work 11/11/13.   R knee pain. He saw Dr. Louanne Skye.  Started having trouble in the summertime.  "I must have pulled something."  Wasn't improved overall, until injected recently.  Some better after the injection. Imaging was done per ortho.  "I'm turning in when I walk, so I don't know if I tore something."  Has f/u next week with ortho.  Exercise has been limited in the meantime, due to pain.  He's going to try an exercise bike, discussed.    He'll have his f/u liver u/s through [facility].  He's going to check on the PA.    He's going to [place] in 02/2014 and he'll send me his papers about shots, etc, for me to check on that.    Meds, vitals, and allergies reviewed.   ROS: See HPI.  Otherwise negative.    GEN: nad, alert and oriented HEENT: mucous membranes moist NECK: supple w/o LA CV: rrr. PULM: ctab, no inc wob ABD: soft, +bs EXT: no edema SKIN: no acute rash Walking with slight limp from knee pain.   Diabetic foot exam: Normal inspection No skin breakdown No calluses  Normal DP pulses Normal sensation to light touch and monofilament Nails normal

## 2013-11-28 NOTE — Assessment & Plan Note (Signed)
He's on less meds than prev, and with the knee complications his exercise routine has been disrupted.  He'll work on getting on the exercise bike and will f/u with ortho.  He'll continue work on diet.  All d/w pt.  He agrees.  No change in meds at his point.  We didn't  Add on a a statin given his hepatic issues prev.  He'll f/u with [facility] re: liver u/s. >25 minutes spent in face to face time with patient, >50% spent in counselling or coordination of care.

## 2013-11-29 ENCOUNTER — Other Ambulatory Visit: Payer: Self-pay | Admitting: Specialist

## 2013-11-29 DIAGNOSIS — M25561 Pain in right knee: Secondary | ICD-10-CM

## 2013-12-07 ENCOUNTER — Ambulatory Visit
Admission: RE | Admit: 2013-12-07 | Discharge: 2013-12-07 | Disposition: A | Discharge status: Home / Self Care | Payer: BC Managed Care – PPO | Source: Ambulatory Visit | Attending: Specialist | Admitting: Specialist

## 2013-12-07 DIAGNOSIS — M25561 Pain in right knee: Secondary | ICD-10-CM

## 2013-12-14 ENCOUNTER — Ambulatory Visit (HOSPITAL_COMMUNITY): Payer: BC Managed Care – PPO

## 2013-12-20 ENCOUNTER — Other Ambulatory Visit (HOSPITAL_BASED_OUTPATIENT_CLINIC_OR_DEPARTMENT_OTHER): Payer: Self-pay | Admitting: Specialist

## 2013-12-21 NOTE — H&P (Signed)
[place] [name] is an 63 y.o. male.   CHIEF COMPLAINT:  Right knee pain with swelling and catching.     HISTORY OF PRESENT ILLNESS:  This 63 year old male is seen today for followup of the right knee.  I have been following him for the last 3-1/2 months with problems of right-sided knee pain and discomfort.  It occurred after he was doing some work and climbing up and down ladders this past summer in June.  He has had recurring episodes of swelling since then with a pulling sensation over the medial aspect of the right knee.  Recurring swelling and difficulty with fully extending the knee and fully flexing the knee.     RADIOGRAPHS:  Plain radiographs showed only mild degenerative changes.  His findings were consistent with media meniscus tear.  An MRI scan has been done and it shows a complex tear involving the posterior body and posterior horn of the right knee and medial meniscus  Past Medical History  Diagnosis Date  . 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 Chronic hepatitis C without mention of hepatic coma     treatment at [place]  . 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 Diabetes mellitus     type II  . Hypertension   . Allergy     Past Surgical History  Procedure Laterality Date  . Thyroid lobectomy  2005    right  . Esophagogastroduodenoscopy      multiple  . Colonoscopy      Family History  Problem Relation Age of Onset  . Hypertension Mother   . Stroke Mother   . Cancer Father     stomach CA  . Hypertension Father   . Hypertension Sister   . Diabetes Sister   . Hypertension Brother   . Prostate cancer Neg Hx   . Colon cancer Neg Hx    Social History:  reports that he quit smoking about 35 years ago. He has never used smokeless tobacco. He reports that he does not drink alcohol or use illicit drugs.  Allergies:  Allergies  Allergen Reactions  . Clonidine Derivatives     cramps    No prescriptions prior to  admission    No results found for this or any previous visit (from the past 48 hour(s)). No results found.  Review of Systems  Musculoskeletal: Positive for joint pain.       Right Knee pain    There were no vitals taken for this visit. Physical Exam  Musculoskeletal:  Right knee: Clinically his exam shows that he lacks about 5 degrees of full extension with discomfort medially.  He is tender along the medial joint line.  McMurray's sign, though, is negative.  He has pain with full flexion of the right knee.  He has minimal effusion   EKG preop shows findings consistent with old previous MI.   ASSESSMENT:  Persistent discomfort of the right knee with mechanical symptoms consistent with a medial meniscal injury. EKG changes consistent with previous myocardia injury.     PLAN:  schedule pt to undergo a right knee arthroscopy with partial medial meniscectomy.  The risks of surgery including infection, bleeding and neurologic compromise were discussed with pt.  The risk of infection is slightly increased with a history of diabetes.  We will give antibiotics, 2 g of Ancef preoperatively.  This surgery date was cancelled and the Patient referred to Dr. Johnsie Cancel, Cardiology for evaluation.  Will reschedule at a  Later date with cardiology clearance.  [name],[name] 12/21/2013, 2:57 PM

## 2013-12-22 ENCOUNTER — Encounter (HOSPITAL_BASED_OUTPATIENT_CLINIC_OR_DEPARTMENT_OTHER): Payer: Self-pay | Admitting: Anesthesiology

## 2013-12-22 ENCOUNTER — Encounter (HOSPITAL_BASED_OUTPATIENT_CLINIC_OR_DEPARTMENT_OTHER)
Admission: RE | Admit: 2013-12-22 | Discharge: 2013-12-22 | Disposition: A | Discharge status: Home / Self Care | Payer: BC Managed Care – PPO | Source: Ambulatory Visit | Attending: Specialist | Admitting: Specialist

## 2013-12-22 ENCOUNTER — Encounter (HOSPITAL_BASED_OUTPATIENT_CLINIC_OR_DEPARTMENT_OTHER): Payer: Self-pay | Admitting: *Deleted

## 2013-12-22 DIAGNOSIS — Z01818 Encounter for other preprocedural examination: Secondary | ICD-10-CM | POA: Insufficient documentation

## 2013-12-22 LAB — BASIC METABOLIC PANEL WITH GFR
Anion gap: 14 (ref 5–15)
BUN: 13 mg/dL (ref 6–23)
CO2: 30 meq/L (ref 19–32)
Calcium: 9.6 mg/dL (ref 8.4–10.5)
Chloride: 97 meq/L (ref 96–112)
Creatinine, Ser: 0.82 mg/dL (ref 0.50–1.35)
GFR calc Af Amer: >90 mL/min
GFR calc non Af Amer: >90 mL/min
Glucose, Bld: 126 mg/dL — ABNORMAL HIGH (ref 70–99)
Potassium: 3.8 meq/L (ref 3.7–5.3)
Sodium: 141 meq/L (ref 137–147)

## 2013-12-22 NOTE — Progress Notes (Signed)
Will come in for ekg-bmet-cleared for surgery-

## 2013-12-22 NOTE — Progress Notes (Signed)
I was concerned that this pt had a suspicious ECG and, being diabetic, felt he should see a cardiologist before surgery is done.  He has had no symptoms of CV disease such as chest pain, SOB or difficulty with walking. He is to be seen by Dr. Johnsie Cancel on Nov. 13.  Dr. Louanne Skye is aware of this visit and we look forward to his cardiac evaluation.

## 2013-12-23 ENCOUNTER — Encounter: Payer: Self-pay | Admitting: Cardiovascular Disease

## 2013-12-23 ENCOUNTER — Ambulatory Visit (INDEPENDENT_AMBULATORY_CARE_PROVIDER_SITE_OTHER): Payer: BC Managed Care – PPO | Admitting: Cardiovascular Disease

## 2013-12-23 VITALS — BP 150/90 | HR 75 | Ht 75.5 in | Wt 263.8 lb

## 2013-12-23 DIAGNOSIS — E119 Type 2 diabetes mellitus without complications: Secondary | ICD-10-CM

## 2013-12-23 DIAGNOSIS — R9431 Abnormal electrocardiogram [ECG] [EKG]: Secondary | ICD-10-CM

## 2013-12-23 DIAGNOSIS — Z01810 Encounter for preprocedural cardiovascular examination: Secondary | ICD-10-CM | POA: Insufficient documentation

## 2013-12-23 DIAGNOSIS — B182 Chronic viral hepatitis C: Secondary | ICD-10-CM

## 2013-12-23 NOTE — Assessment & Plan Note (Signed)
Discussed low carb diet.  Target hemoglobin A1c is 6.5 or less.  Continue current medications. On ACE  Was on insulin in past

## 2013-12-23 NOTE — Assessment & Plan Note (Signed)
Proposed surgery is low risk but poorly controlled diabetic with ECG;s strongly suggestive of silent IMI  Will order lexiscan myovue since knee pain precludes Exercise and Echo to assess EF and inferior wall motion

## 2013-12-23 NOTE — Progress Notes (Signed)
Patient ID: [place] [name], male   DOB: 12-Nov-1950, 63 y.o.   MRN: [id number]    63 yo referred by anesthesia Dr Al Corpus for abnormal ECG. He needs right knee arthroscopy with partial medial meniscectomy.  Reviewed ECG from 12/22/13  SR rate 74  LAD IMI inferolateral T wave changes   Quit smoking in 80's  Has HTN  Not on statin BS poorly controlled and A1c chronically runs 7.5 range last checked 10/15  LDL is 138  He sees hepatology at [facility] for cirrhosis   Has hepatitis C previous endocscopy negative for varices PLTls chronically low 119-139   He injured knee doing patio work.  He denies dyspnea chest pain or palpitations Denies history of MI.  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  All other systems reviewed and negative   General: Affect appropriate Overweight black male  HEENT: normal Neck supple with no adenopathy JVP normal no bruits no thyromegaly Lungs clear with no wheezing and good diaphragmatic motion Heart:  S1/S2 no murmur,rub, gallop or click PMI normal Abdomen: benighn, BS positve, no tenderness, no AAA no bruit.  No HSM or HJR Distal pulses intact with no bruits No edema Neuro non-focal Skin warm and dry No muscular weakness  Medications Current Outpatient Prescriptions  Medication Sig Dispense Refill  . amLODipine (NORVASC) 10 MG tablet Take 0.5-1 tablets (5-10 mg total) by mouth daily. (Patient taking differently: Take 5-10 mg by mouth every evening. ) 90 tablet 3  . fexofenadine-pseudoephedrine (ALLEGRA-D) 60-120 MG per tablet Take 1 tablet by mouth.    Marland Kitchen glucose blood (ONE TOUCH ULTRA TEST) test strip Use daily as needed for diabetes 100 each 12  . Lancets  (ACCU-CHEK MULTICLIX) lancets USE AS DIRECTED 102 each 1  . lisinopril (PRINIVIL,ZESTRIL) 40 MG tablet TAKE 1 TABLET (40 MG TOTAL) BY MOUTH DAILY. 90 tablet 3  . metFORMIN (GLUCOPHAGE) 1000 MG tablet TAKE 1 TABLET BY MOUTH TWO TIMES A DAY 180 tablet 3  . metoprolol succinate (TOPROL-XL) 100 MG 24 hr tablet Take 100 mg by mouth.    . montelukast (SINGULAIR) 10 MG tablet Take 10 mg by mouth at bedtime.    . Multiple Vitamin (MULTIVITAMIN) tablet Take 1 tablet by mouth daily.      . valsartan-hydrochlorothiazide (DIOVAN-HCT) 320-25 MG per tablet TAKE 1 TABLET BY MOUTH EVERY DAY 90 tablet 3   No current facility-administered medications for this visit.    Allergies Clonidine derivatives  Family History: Family History  Problem Relation Age of Onset  . Hypertension Mother   . Stroke Mother   . Cancer Father     stomach CA  . Hypertension Father   . Hypertension Sister   . Diabetes Sister   . Hypertension Brother   . Prostate cancer Neg Hx   . Colon cancer Neg Hx     Social History: History   Social History  . Marital Status: Married    Spouse Name: N/A    Number of Children: N/A  . Years of Education: N/A   Occupational History  . Not on file.   Social History Main Topics  . Smoking status: Former Smoker    Quit date: 02/10/1978  . Smokeless tobacco: Never Used  .  Alcohol Use: No  . Drug Use: No  . Sexual Activity: Not on file   Other Topics Concern  . Not on file   Social History Narrative   Married 1978   Chaplain, at [employer].      Past Surgical History  Procedure Laterality Date  . Thyroid lobectomy  2005    right  . Esophagogastroduodenoscopy      multiple  . Colonoscopy      Past Medical History  Diagnosis Date  . 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 Chronic hepatitis C without mention of hepatic coma     treatment at [place]  . 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 Diabetes  mellitus     type II  . Hypertension   . Allergy   . Full dentures   . Wears glasses     Electrocardiogram:  11/02/13  SR rate 85  IMI early R wave transition ? Posterior MI  ECG 11/12 shows more inferolateral T wave changes compared  Assessment and Plan

## 2013-12-23 NOTE — Assessment & Plan Note (Signed)
Well controlled.  Continue current medications and low sodium Dash type diet.    

## 2013-12-23 NOTE — Patient Instructions (Signed)
Your physician wants you to follow-up in:   YEAR WITH  DR NISHAN  You will receive a reminder letter in the mail two months in advance. If you don't receive a letter, please call our office to schedule the follow-up appointment. Your physician recommends that you continue on your current medications as directed. Please refer to the Current Medication list given to you today.  Your physician has requested that you have an echocardiogram. Echocardiography is a painless test that uses sound waves to create images of your heart. It provides your doctor with information about the size and shape of your heart and how well your heart's chambers and valves are working. This procedure takes approximately one hour. There are no restrictions for this procedure. Your physician has requested that you have a lexiscan myoview. For further information please visit [email or web address]. Please follow instruction sheet, as given.  

## 2013-12-23 NOTE — Assessment & Plan Note (Signed)
Rx with simeprevir and sofosbuvir 2014 with "cure"  Residual elevation in LFTs with low PLTls  Don't think bleeding risk high F/U Dr Carlean Purl  No varices on EGD  Biyearly [place]

## 2013-12-23 NOTE — Progress Notes (Signed)
Dr Crews spoke with pt concerning abnormal ekg.  Dr Crews wants cardiac work up prior to surgery. Pt is very agreeable and voices understanding.  Pt requested Dr Hank Smith, unable to get appointment with him till Jan so pt accepted appointment with Dr Peter Nishan at 400 pm on the 11-13 15.  Dr. Nitka notified of cardiac work up before surgery performed, understands. He will contact us concerning surgery if postponement is needed.   

## 2013-12-26 ENCOUNTER — Ambulatory Visit (HOSPITAL_COMMUNITY): Payer: BC Managed Care – PPO | Attending: Cardiology | Admitting: Radiology

## 2013-12-26 DIAGNOSIS — R9431 Abnormal electrocardiogram [ECG] [EKG]: Secondary | ICD-10-CM

## 2013-12-26 DIAGNOSIS — I10 Essential (primary) hypertension: Secondary | ICD-10-CM | POA: Diagnosis not present

## 2013-12-26 DIAGNOSIS — E119 Type 2 diabetes mellitus without complications: Secondary | ICD-10-CM | POA: Diagnosis not present

## 2013-12-26 DIAGNOSIS — Z01810 Encounter for preprocedural cardiovascular examination: Secondary | ICD-10-CM | POA: Diagnosis not present

## 2013-12-26 DIAGNOSIS — Z87891 Personal history of nicotine dependence: Secondary | ICD-10-CM | POA: Insufficient documentation

## 2013-12-26 NOTE — Progress Notes (Signed)
Echocardiogram performed.  

## 2013-12-27 ENCOUNTER — Ambulatory Visit (HOSPITAL_BASED_OUTPATIENT_CLINIC_OR_DEPARTMENT_OTHER): Admission: RE | Admit: 2013-12-27 | Payer: BC Managed Care – PPO | Source: Ambulatory Visit | Admitting: Specialist

## 2013-12-27 ENCOUNTER — Ambulatory Visit (HOSPITAL_COMMUNITY): Payer: BC Managed Care – PPO | Attending: Cardiology | Admitting: Radiology

## 2013-12-27 DIAGNOSIS — Z01810 Encounter for preprocedural cardiovascular examination: Secondary | ICD-10-CM

## 2013-12-27 DIAGNOSIS — E119 Type 2 diabetes mellitus without complications: Secondary | ICD-10-CM | POA: Diagnosis not present

## 2013-12-27 DIAGNOSIS — R9431 Abnormal electrocardiogram [ECG] [EKG]: Secondary | ICD-10-CM | POA: Diagnosis not present

## 2013-12-27 DIAGNOSIS — I10 Essential (primary) hypertension: Secondary | ICD-10-CM | POA: Diagnosis not present

## 2013-12-27 HISTORY — DX: Presence of spectacles and contact lenses: Z97.3

## 2013-12-27 HISTORY — DX: Complete loss of teeth, unspecified cause, unspecified class: K08.109

## 2013-12-27 HISTORY — DX: Presence of dental prosthetic device (complete) (partial): Z97.2

## 2013-12-27 SURGERY — ARTHROSCOPY, KNEE
Anesthesia: General | Laterality: Right

## 2013-12-27 MED ORDER — TECHNETIUM TC 99M SESTAMIBI GENERIC - CARDIOLITE
11.0000 | Freq: Once | INTRAVENOUS | Status: AC | PRN
  Administered 2013-12-27: 11 via INTRAVENOUS

## 2013-12-27 MED ORDER — REGADENOSON 0.4 MG/5ML IV SOLN
0.4000 mg | Freq: Once | INTRAVENOUS | Status: AC
  Administered 2013-12-27: 0.4 mg via INTRAVENOUS

## 2013-12-27 MED ORDER — TECHNETIUM TC 99M SESTAMIBI GENERIC - CARDIOLITE
33.0000 | Freq: Once | INTRAVENOUS | Status: AC | PRN
  Administered 2013-12-27: 33 via INTRAVENOUS

## 2013-12-27 NOTE — Progress Notes (Signed)
[facility] [address] [place], [place] [address] [phone]    Cardiology Nuclear Med Study  [place] [name] is a 63 y.o. male     MRN : [id number]     DOB: [birth year]  Procedure Date: 12/27/2013  Nuclear Med Background Indication for Stress Test:  Evaluation for Ischemia, Abnormal EKG, and Pending Surgical Clearance for (R) Knee Arthroscopy with Partial Medial Meniscectomy by Basil Dess, MD History:  No known CAD Cardiac Risk Factors: Hypertension and NIDDM  Symptoms:  None indicated   Nuclear Pre-Procedure Caffeine/Decaff Intake:  None> 12 hrs NPO After: 8:00pm   Lungs:  clear O2 Sat: 95% on room air. IV 0.9% NS with Angio Cath:  22g  IV Site: R Hand x 1, tolerated well IV Started by:  Irven Baltimore, RN  Chest Size (in):  48 Cup Size: n/a  Height: 6' 3.5" (1.918 m)  Weight:  258 lb (117.028 kg)  BMI:  Body mass index is 31.81 kg/(m^2). Tech Comments:  Patient took Lisinopril and Diovan-HCT, but held Toprol and Metformin this am. Irven Baltimore, RN.    Nuclear Med Study 1 or 2 day study: 1 day  Stress Test Type:  Carlton Adam  Reading MD: N/A  Order Authorizing Provider:  Jenkins Rouge, MD  Resting Radionuclide: Technetium 4m Sestamibi  Resting Radionuclide Dose: 11.0 mCi   Stress Radionuclide:  Technetium 21m Sestamibi  Stress Radionuclide Dose: 33.0 mCi           Stress Protocol Rest HR: 73 Stress HR: 96  Rest BP: 161/95 Stress BP: 148/94  Exercise Time (min): n/a METS: n/a   Predicted Max HR: 158 bpm % Max HR: 60.76 bpm Rate Pressure Product: 15936   Dose of Adenosine (mg):  n/a Dose of Lexiscan: 0.4 mg  Dose of Atropine (mg): n/a Dose of Dobutamine: n/a mcg/kg/min (at max HR)  Stress Test Technologist: Glade Lloyd, BS-ES  Nuclear Technologist:  Earl Many, CNMT     Rest Procedure:  Myocardial perfusion imaging was performed at rest 45 minutes following the intravenous administration of Technetium 41m Sestamibi. Rest  ECG: NSR with nonspecific T wave abnormality  Stress Procedure:  The patient received IV Lexiscan 0.4 mg over 15-seconds.  Technetium 33m Sestamibi injected at 30-seconds.  Quantitative spect images were obtained after a 45 minute delay.  During the infusion of Lexiscan the patient complained of feeling a little different.  This resolved in recovery.  Stress ECG: No significant change from baseline ECG  QPS Raw Data Images:  Normal; no motion artifact; normal heart/lung ratio. Stress Images:  There is decreased uptake in the lateral wall. Rest Images:  There is decreased uptake in the inferior wall. Subtraction (SDS):  There is a very small, mild partially reversible defect in the apical lateral wall Transient Ischemic Dilatation (Normal <1.22):  1.03 Lung/Heart Ratio (Normal <0.45):  0.24  Quantitative Gated Spect Images QGS EDV:  108 ml QGS ESV:  42 ml  Impression Exercise Capacity:  Lexiscan with no exercise. BP Response:  Hypotensive blood pressure response. Clinical Symptoms:  No significant symptoms noted. ECG Impression:  No change from baseline EKG with diffuse T wave inversions. Comparison with Prior Nuclear Study: No images to compare  Overall Impression:  Intermediate risk stress nuclear study with a very small sized, mild in intensity partially reversible defect in the apical lateral wall which could be due to variations in diaphragmatica attenuation but cannot rule out a small area of ischemia.  Marland Kitchen  LV Ejection Fraction: 61%.  LV Wall Motion:  NL LV Function; NL Wall Motion  Signed: Fransico Him, MD [facility] 12/27/2013

## 2013-12-29 ENCOUNTER — Telehealth: Payer: Self-pay | Admitting: Cardiovascular Disease

## 2013-12-29 NOTE — Telephone Encounter (Signed)
Pt is aware of lab results. Pt would like to know about the stress results. Pt is aware that Dr. Johnsie Cancel needs to review the stress test results  and make recommendations if needed. Pt verbalized understanding.

## 2013-12-29 NOTE — Telephone Encounter (Signed)
New message    Calling for test results  

## 2013-12-30 NOTE — Telephone Encounter (Signed)
LMTCB ./CY 

## 2013-12-30 NOTE — Telephone Encounter (Signed)
PT AWARE OF  MYOVIEW  AND  ECHO RESULTS .[place] [name]

## 2013-12-30 NOTE — Telephone Encounter (Signed)
Follow up  ° ° ° °Returning call back to nurse  °

## 2014-01-11 ENCOUNTER — Ambulatory Visit (HOSPITAL_COMMUNITY)
Admission: RE | Admit: 2014-01-11 | Discharge: 2014-01-11 | Disposition: A | Discharge status: Home / Self Care | Payer: BC Managed Care – PPO | Source: Ambulatory Visit | Attending: Internal Medicine | Admitting: Internal Medicine

## 2014-01-11 DIAGNOSIS — K7460 Unspecified cirrhosis of liver: Secondary | ICD-10-CM | POA: Insufficient documentation

## 2014-01-11 DIAGNOSIS — B1920 Unspecified viral hepatitis C without hepatic coma: Secondary | ICD-10-CM | POA: Diagnosis not present

## 2014-01-11 DIAGNOSIS — I10 Essential (primary) hypertension: Secondary | ICD-10-CM | POA: Insufficient documentation

## 2014-01-11 DIAGNOSIS — K829 Disease of gallbladder, unspecified: Secondary | ICD-10-CM | POA: Insufficient documentation

## 2014-01-11 DIAGNOSIS — E119 Type 2 diabetes mellitus without complications: Secondary | ICD-10-CM | POA: Insufficient documentation

## 2014-01-11 NOTE — Progress Notes (Signed)
Quick Note:  Cirrhosis but no signs of cancer/tumor Repeat an [place] in 6 months - limited RUQ please - re: cirrhosis screen for hepatic cancer ______

## 2014-01-17 ENCOUNTER — Ambulatory Visit (INDEPENDENT_AMBULATORY_CARE_PROVIDER_SITE_OTHER): Payer: BC Managed Care – PPO | Admitting: Family Medicine

## 2014-01-17 ENCOUNTER — Encounter: Payer: Self-pay | Admitting: Family Medicine

## 2014-01-17 VITALS — BP 114/80 | HR 86 | Temp 98.2°F | Wt 264.2 lb

## 2014-01-17 DIAGNOSIS — J019 Acute sinusitis, unspecified: Secondary | ICD-10-CM

## 2014-01-17 MED ORDER — AMLODIPINE BESYLATE 10 MG PO TABS: 10.0000 mg | ORAL_TABLET | Freq: Every evening | ORAL | Status: DC

## 2014-01-17 MED ORDER — DOXYCYCLINE HYCLATE 100 MG PO TABS: 100.0000 mg | ORAL_TABLET | Freq: Two times a day (BID) | ORAL | Status: DC

## 2014-01-17 NOTE — Patient Instructions (Signed)
Start the doxycycline and use a neti pot.  Try to limit afrin use.  If not better, then notify me.   Take care.  Try to get some rest.

## 2014-01-17 NOTE — Progress Notes (Signed)
Pre visit review using our clinic review tool, if applicable. No additional management support is needed unless otherwise documented below in the visit note.  He had his R knee injected and is some better in the meantime. His [place] trip is put off for now.  He is back in the gym, doing very light work now.   duration of symptoms: just under 1 week.  Rhinorrhea: some Congestion: more stuffy than runny, worse at night.  Sleep disrupted.  Had to use afrin occ at night.  Tried OTC cold/cough meds ear pain: no sore throat: mild, likely from the cough and post nasal gtt. Cough:yes, some better with robitussin.  Myalgias: mild No fevers but had sweats and chills.   other concerns:wife is sick.  Mult sick contacts at work.   Voice sounds more nasal.   He's off work the next few days.    ROS: See HPI.  Otherwise negative.    Meds, vitals, and allergies reviewed.   GEN: nad, alert and oriented HEENT: mucous membranes moist, TM w/o erythema, nasal epithelium injected, OP with cobblestoning NECK: supple w/o LA CV: rrr. PULM: ctab, no inc wob ABD: soft, +bs EXT: no edema

## 2014-01-18 DIAGNOSIS — J019 Acute sinusitis, unspecified: Secondary | ICD-10-CM | POA: Insufficient documentation

## 2014-01-18 NOTE — Assessment & Plan Note (Signed)
Likely dx. Start the doxycycline and use a neti pot. Try to limit afrin use. If not better, then he'll notify me.  He agrees. Okay for outpatient f/u.

## 2014-01-31 ENCOUNTER — Telehealth: Payer: Self-pay

## 2014-01-31 MED ORDER — BENZONATATE 200 MG PO CAPS: 200.0000 mg | ORAL_CAPSULE | Freq: Three times a day (TID) | ORAL | Status: DC | PRN

## 2014-01-31 NOTE — Telephone Encounter (Signed)
I would tessalon (and nasal saline if not already doing that).  I sent the tessalon in.  Thanks. Hope he feels better soon.

## 2014-01-31 NOTE — Telephone Encounter (Signed)
Pt left v/m; pt was seen 01/17/14 and is still coughing due to sinus drainage at back of throat; OTC meds are not helping and pt request rx for cough med. Pt request cb.

## 2014-01-31 NOTE — Telephone Encounter (Signed)
Wife advised. 

## 2014-05-17 ENCOUNTER — Telehealth: Payer: Self-pay | Admitting: Family Medicine

## 2014-05-17 DIAGNOSIS — E119 Type 2 diabetes mellitus without complications: Secondary | ICD-10-CM

## 2014-05-17 NOTE — Telephone Encounter (Signed)
Patient is having a cpx done on 05/29/14.  Patient would like to have his labs done at the [place] office on 05/23/14. Please put in lab order for [place]. Patient will check with [facility] to make sure the orders are in before he goes for labwork.

## 2014-05-17 NOTE — Telephone Encounter (Signed)
Order are in.  Thanks.

## 2014-05-17 NOTE — Telephone Encounter (Signed)
Patient notified by telephone that orders are in.

## 2014-05-17 NOTE — Addendum Note (Signed)
Addended by: Tonia Ghent on: 05/17/2014 06:26 PM   Modules accepted: Orders

## 2014-05-22 ENCOUNTER — Other Ambulatory Visit: Payer: BC Managed Care – PPO

## 2014-05-23 ENCOUNTER — Other Ambulatory Visit (INDEPENDENT_AMBULATORY_CARE_PROVIDER_SITE_OTHER): Payer: Self-pay

## 2014-05-23 DIAGNOSIS — E119 Type 2 diabetes mellitus without complications: Secondary | ICD-10-CM

## 2014-05-23 LAB — COMPREHENSIVE METABOLIC PANEL
ALT: 21 U/L (ref 0–53)
AST: 16 U/L (ref 0–37)
Albumin: 4 g/dL (ref 3.5–5.2)
Alkaline Phosphatase: 44 U/L (ref 39–117)
BILIRUBIN TOTAL: 0.6 mg/dL (ref 0.2–1.2)
BUN: 13 mg/dL (ref 6–23)
CALCIUM: 8.9 mg/dL (ref 8.4–10.5)
CO2: 30 meq/L (ref 19–32)
CREATININE: 0.93 mg/dL (ref 0.40–1.50)
Chloride: 100 mEq/L (ref 96–112)
GFR: 105.44 mL/min (ref >60.00)
Glucose, Bld: 149 mg/dL — ABNORMAL HIGH (ref 70–99)
Potassium: 3.8 mEq/L (ref 3.5–5.1)
Sodium: 137 mEq/L (ref 135–145)
TOTAL PROTEIN: 7.3 g/dL (ref 6.0–8.3)

## 2014-05-23 LAB — LIPID PANEL
Cholesterol: 163 mg/dL (ref 0–200)
HDL: 30.5 mg/dL — AB (ref >39.00)
LDL Cholesterol: 108 mg/dL — ABNORMAL HIGH (ref 0–99)
NonHDL: 132.5
Total CHOL/HDL Ratio: 5
Triglycerides: 124 mg/dL (ref 0.0–149.0)
VLDL: 24.8 mg/dL (ref 0.0–40.0)

## 2014-05-23 LAB — HEMOGLOBIN A1C: HEMOGLOBIN A1C: 8 % — AB (ref 4.6–6.5)

## 2014-05-29 ENCOUNTER — Ambulatory Visit (INDEPENDENT_AMBULATORY_CARE_PROVIDER_SITE_OTHER): Payer: BLUE CROSS/BLUE SHIELD | Admitting: Family Medicine

## 2014-05-29 ENCOUNTER — Encounter: Payer: Self-pay | Admitting: Family Medicine

## 2014-05-29 VITALS — BP 128/80 | HR 64 | Temp 97.6°F | Wt 260.8 lb

## 2014-05-29 DIAGNOSIS — Z0000 Encounter for general adult medical examination without abnormal findings: Secondary | ICD-10-CM

## 2014-05-29 DIAGNOSIS — E119 Type 2 diabetes mellitus without complications: Secondary | ICD-10-CM

## 2014-05-29 DIAGNOSIS — I10 Essential (primary) hypertension: Secondary | ICD-10-CM

## 2014-05-29 DIAGNOSIS — Z7189 Other specified counseling: Secondary | ICD-10-CM | POA: Insufficient documentation

## 2014-05-29 DIAGNOSIS — B182 Chronic viral hepatitis C: Secondary | ICD-10-CM

## 2014-05-29 MED ORDER — METFORMIN HCL 1000 MG PO TABS: 1000.0000 mg | ORAL_TABLET | Freq: Two times a day (BID) | ORAL | Status: DC

## 2014-05-29 MED ORDER — VALSARTAN-HYDROCHLOROTHIAZIDE 320-25 MG PO TABS: 1.0000 | ORAL_TABLET | Freq: Every day | ORAL | Status: DC

## 2014-05-29 MED ORDER — GLUCOSE BLOOD VI STRP: ORAL_STRIP | Status: DC

## 2014-05-29 MED ORDER — METOPROLOL SUCCINATE ER 100 MG PO TB24: 100.0000 mg | ORAL_TABLET | Freq: Every day | ORAL | Status: DC

## 2014-05-29 MED ORDER — ONETOUCH ULTRASOFT LANCETS MISC: Status: DC

## 2014-05-29 MED ORDER — AMLODIPINE BESYLATE 5 MG PO TABS: 5.0000 mg | ORAL_TABLET | Freq: Every evening | ORAL | Status: DC

## 2014-05-29 MED ORDER — LISINOPRIL 40 MG PO TABS: ORAL_TABLET | ORAL | Status: DC

## 2014-05-29 NOTE — Assessment & Plan Note (Signed)
He'll get back on exercise routine, will work on diet, d/w pt about both.  No med changes at this point.  Recheck in about 3 months.  I think he can lower A1c without extra meds.  He agrees.  A1c trend d/w pt.

## 2014-05-29 NOTE — Patient Instructions (Addendum)
Make sure you have routine cardiac follow up this fall.   GI will likely contact you in the next month about a follow up ultrasound.  If not, then call Dr. Celesta Aver office.  Check with your insurance to see if they will cover the shingles shot. Try to get a little more exercise and recheck A1c in about 3 months before a visit.   Take care.  Glad to see you.  Thank you for your effort.

## 2014-05-29 NOTE — Assessment & Plan Note (Signed)
Controlled, continue as is with current meds.  Labs d/w pt.  He agrees.

## 2014-05-29 NOTE — Assessment & Plan Note (Signed)
Routine anticipatory guidance given to patient. See health maintenance.  Tetanus 2012  Flu done at work  Shingles- d/w pt.  PNA shot prev done.  Colonoscopy prev done by GI 2015  Prostate cancer screening and PSA options (with potential risks and benefits of testing vs not testing) were discussed along with recent recs/guidelines. He declined testing PSA at this point.  Living will d/w pt. He's working on this. Wife designated if patient were incapacitated.  Diet and exercise d/w pt.

## 2014-05-29 NOTE — Assessment & Plan Note (Signed)
He'll call GI if they don't call him about fu u/s.  He agrees.

## 2014-05-29 NOTE — Progress Notes (Signed)
Pre visit review using our clinic review tool, if applicable. No additional management support is needed unless otherwise documented below in the visit note.  CPE- See plan.  Routine anticipatory guidance given to patient.  See health maintenance. Tetanus 2012 Flu done at work  Shingles- d/w pt.  PNA shot prev done. Colonoscopy prev done by GI 2015 Prostate cancer screening and PSA options (with potential risks and benefits of testing vs not testing) were discussed along with recent recs/guidelines.  He declined testing PSA at this point. Living will d/w pt. He's working on this. Wife designated if patient were incapacitated.  Diet and exercise d/w pt.see below.    HCV.  Had u/s done 01/2014 with plan for f/u in ~06/2014 with u/s by Dr. Carlean Purl. D/w pt.  Not on treatment now, undetectable viral load after treatment.   H/o abnormal EKG with f/u stress test w/o sign of MI.  He doesn't (and didn't) have chest pain.  He'll have f/u with cards this fall.   Hypertension:    Using medication without problems or lightheadedness:  yes Chest pain with exertion:no Edema:no Short of breath:no  Diabetes:  Using medications without difficulties: no Hypoglycemic episodes:no Hyperglycemic episodes: no Feet problems: no Blood Sugars averaging: ~160 recently eye exam within last year: yes Wife recently on a heart monitor and his son recently had a SZ (so he is driving for him).  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  All this affected diet and exercise.  A1c up, not surprisingly, d/w pt.    PMH and SH reviewed  Meds, vitals, and allergies reviewed.   ROS: See HPI.  Otherwise negative.    GEN: nad, alert and oriented HEENT: mucous membranes moist NECK: supple w/o LA CV: rrr. PULM: ctab, no inc wob ABD: soft, +bs EXT: no edema SKIN: no acute rash  Diabetic foot exam: Normal inspection No skin breakdown No calluses  Normal DP pulses Normal sensation to light touch and  monofilament Nails normal

## 2014-06-09 ENCOUNTER — Other Ambulatory Visit: Payer: Self-pay | Admitting: Family Medicine

## 2014-06-13 ENCOUNTER — Other Ambulatory Visit: Payer: Self-pay | Admitting: Family Medicine

## 2014-07-13 ENCOUNTER — Telehealth: Payer: Self-pay

## 2014-07-13 DIAGNOSIS — K7469 Other cirrhosis of liver: Secondary | ICD-10-CM

## 2014-07-13 NOTE — Telephone Encounter (Signed)
-----   Message from Kellie Moor, RN sent at 01/11/2014  1:56 PM EST ----- Needs [place] see results on 01/11/14

## 2014-07-13 NOTE — Telephone Encounter (Signed)
I spoke with the patient and he is available the week of 07/31/14.  I advised him I will call him in the am with the appt details

## 2014-07-14 NOTE — Telephone Encounter (Signed)
Patient is scheduled for [facility] for 07/31/14 9:00.  He is asked to by NPO after midnight and to arrive at 8:45 in radiology

## 2014-07-31 ENCOUNTER — Ambulatory Visit (HOSPITAL_COMMUNITY): Payer: BLUE CROSS/BLUE SHIELD

## 2014-08-11 ENCOUNTER — Other Ambulatory Visit: Payer: Self-pay | Admitting: Family Medicine

## 2014-08-20 ENCOUNTER — Other Ambulatory Visit: Payer: Self-pay | Admitting: Family Medicine

## 2014-08-20 DIAGNOSIS — E119 Type 2 diabetes mellitus without complications: Secondary | ICD-10-CM

## 2014-08-23 ENCOUNTER — Other Ambulatory Visit (INDEPENDENT_AMBULATORY_CARE_PROVIDER_SITE_OTHER): Payer: BLUE CROSS/BLUE SHIELD

## 2014-08-23 DIAGNOSIS — E119 Type 2 diabetes mellitus without complications: Secondary | ICD-10-CM

## 2014-08-23 LAB — HEMOGLOBIN A1C: HEMOGLOBIN A1C: 7.5 % — AB (ref 4.6–6.5)

## 2014-08-28 ENCOUNTER — Ambulatory Visit (INDEPENDENT_AMBULATORY_CARE_PROVIDER_SITE_OTHER): Payer: BLUE CROSS/BLUE SHIELD | Admitting: Family Medicine

## 2014-08-28 ENCOUNTER — Encounter: Payer: Self-pay | Admitting: Family Medicine

## 2014-08-28 VITALS — BP 152/92 | HR 68 | Temp 97.8°F | Wt 262.2 lb

## 2014-08-28 DIAGNOSIS — E119 Type 2 diabetes mellitus without complications: Secondary | ICD-10-CM | POA: Diagnosis not present

## 2014-08-28 NOTE — Patient Instructions (Signed)
Recheck A1c in about 3-4 months before a visit.   Glad to see you.  Keep exercising.   Take care.  Thanks for your effort.

## 2014-08-28 NOTE — Progress Notes (Signed)
Pre visit review using our clinic review tool, if applicable. No additional management support is needed unless otherwise documented below in the visit note.  Diabetes: A1c improved.  D/w pt.  Using medications without difficulties: yes Hypoglycemic episodes:no Hyperglycemic episodes:no Feet problems:no Blood Sugars averaging: 162 this AM.  Similar to prev.  Should improve with exercise.   eye exam within last year:yes He is back in the gym.    Meds, vitals, and allergies reviewed.   ROS: See HPI.  Otherwise negative.    GEN: nad, alert and oriented HEENT: mucous membranes moist NECK: supple w/o LA CV: rrr. PULM: ctab, no inc wob ABD: soft, +bs EXT: no edema

## 2014-08-29 NOTE — Assessment & Plan Note (Addendum)
A1c improved.  D/w pt.  Continue meds as is, continue work on diet and exercise.  Recheck in a few months.  He agrees. Okay for outpatient f/u.

## 2014-09-01 ENCOUNTER — Ambulatory Visit (HOSPITAL_COMMUNITY): Payer: BLUE CROSS/BLUE SHIELD

## 2014-09-08 ENCOUNTER — Encounter: Payer: Self-pay | Admitting: Internal Medicine

## 2014-09-22 ENCOUNTER — Ambulatory Visit (HOSPITAL_COMMUNITY)
Admission: RE | Admit: 2014-09-22 | Discharge: 2014-09-22 | Disposition: A | Discharge status: Home / Self Care | Payer: BLUE CROSS/BLUE SHIELD | Source: Ambulatory Visit | Attending: Internal Medicine | Admitting: Internal Medicine

## 2014-09-22 DIAGNOSIS — K7460 Unspecified cirrhosis of liver: Secondary | ICD-10-CM | POA: Diagnosis present

## 2014-09-22 DIAGNOSIS — K7469 Other cirrhosis of liver: Secondary | ICD-10-CM

## 2014-09-22 DIAGNOSIS — R932 Abnormal findings on diagnostic imaging of liver and biliary tract: Secondary | ICD-10-CM | POA: Insufficient documentation

## 2014-09-25 NOTE — Progress Notes (Signed)
Quick Note:  ? Of tiny liver lesion on [place]  1) CMET, AFP - cirrhosis and abnormal liver ultrasound 2) MR liver without and with contrast to evaluate right liver lobe lesion seen on ultrasound ______

## 2014-09-27 ENCOUNTER — Other Ambulatory Visit: Payer: Self-pay | Admitting: *Deleted

## 2014-09-27 DIAGNOSIS — K7460 Unspecified cirrhosis of liver: Secondary | ICD-10-CM

## 2014-09-27 DIAGNOSIS — K769 Liver disease, unspecified: Secondary | ICD-10-CM

## 2014-09-27 DIAGNOSIS — R932 Abnormal findings on diagnostic imaging of liver and biliary tract: Secondary | ICD-10-CM

## 2014-09-28 ENCOUNTER — Other Ambulatory Visit (INDEPENDENT_AMBULATORY_CARE_PROVIDER_SITE_OTHER): Payer: BLUE CROSS/BLUE SHIELD

## 2014-09-28 DIAGNOSIS — R932 Abnormal findings on diagnostic imaging of liver and biliary tract: Secondary | ICD-10-CM | POA: Diagnosis not present

## 2014-09-28 DIAGNOSIS — K7460 Unspecified cirrhosis of liver: Secondary | ICD-10-CM

## 2014-09-28 LAB — COMPREHENSIVE METABOLIC PANEL
ALBUMIN: 4.5 g/dL (ref 3.5–5.2)
ALT: 21 U/L (ref 0–53)
AST: 21 U/L (ref 0–37)
Alkaline Phosphatase: 40 U/L (ref 39–117)
BUN: 12 mg/dL (ref 6–23)
CALCIUM: 9.2 mg/dL (ref 8.4–10.5)
CHLORIDE: 99 meq/L (ref 96–112)
CO2: 32 mEq/L (ref 19–32)
CREATININE: 0.93 mg/dL (ref 0.40–1.50)
GFR: 105.32 mL/min (ref >60.00)
Glucose, Bld: 165 mg/dL — ABNORMAL HIGH (ref 70–99)
POTASSIUM: 3.3 meq/L — AB (ref 3.5–5.1)
Sodium: 139 mEq/L (ref 135–145)
Total Bilirubin: 0.6 mg/dL (ref 0.2–1.2)
Total Protein: 7.7 g/dL (ref 6.0–8.3)

## 2014-09-29 LAB — AFP TUMOR MARKER: AFP-Tumor Marker: 12.6 ng/mL — ABNORMAL HIGH (ref <6.1)

## 2014-09-29 NOTE — Progress Notes (Signed)
Quick Note:  Slight rise in AFP Slightly low K Elevated glucose Await MR  ______

## 2014-10-04 ENCOUNTER — Ambulatory Visit (HOSPITAL_COMMUNITY)
Admission: RE | Admit: 2014-10-04 | Discharge: 2014-10-04 | Disposition: A | Discharge status: Home / Self Care | Payer: BLUE CROSS/BLUE SHIELD | Source: Ambulatory Visit | Attending: Internal Medicine | Admitting: Internal Medicine

## 2014-10-04 DIAGNOSIS — R932 Abnormal findings on diagnostic imaging of liver and biliary tract: Secondary | ICD-10-CM | POA: Insufficient documentation

## 2014-10-04 DIAGNOSIS — Z09 Encounter for follow-up examination after completed treatment for conditions other than malignant neoplasm: Secondary | ICD-10-CM | POA: Diagnosis present

## 2014-10-04 DIAGNOSIS — K7460 Unspecified cirrhosis of liver: Secondary | ICD-10-CM | POA: Insufficient documentation

## 2014-10-04 DIAGNOSIS — K769 Liver disease, unspecified: Secondary | ICD-10-CM

## 2014-10-04 MED ORDER — GADOXETATE DISODIUM 0.25 MMOL/ML IV SOLN
5.0000 mL | Freq: Once | INTRAVENOUS | Status: AC | PRN
  Administered 2014-10-04: 10 mL via INTRAVENOUS

## 2014-10-04 NOTE — Progress Notes (Signed)
Quick Note:  MRI is fine - no liver lesion - ultrasound was incorrect - [place] [name] is a better test Plan to repeat an [place] in 1 year  Ask him to see me later this year (before end of year) for a check up ______

## 2014-12-22 ENCOUNTER — Other Ambulatory Visit: Payer: BLUE CROSS/BLUE SHIELD

## 2014-12-22 ENCOUNTER — Other Ambulatory Visit: Payer: Self-pay | Admitting: Family Medicine

## 2014-12-22 ENCOUNTER — Other Ambulatory Visit (INDEPENDENT_AMBULATORY_CARE_PROVIDER_SITE_OTHER): Payer: BLUE CROSS/BLUE SHIELD

## 2014-12-22 DIAGNOSIS — E119 Type 2 diabetes mellitus without complications: Secondary | ICD-10-CM | POA: Diagnosis not present

## 2014-12-22 LAB — HEMOGLOBIN A1C: Hgb A1c MFr Bld: 7.8 % — ABNORMAL HIGH (ref 4.6–6.5)

## 2014-12-29 ENCOUNTER — Encounter: Payer: Self-pay | Admitting: Family Medicine

## 2014-12-29 ENCOUNTER — Ambulatory Visit (INDEPENDENT_AMBULATORY_CARE_PROVIDER_SITE_OTHER): Payer: BLUE CROSS/BLUE SHIELD | Admitting: Family Medicine

## 2014-12-29 VITALS — BP 152/92 | HR 65 | Temp 97.7°F | Ht 76.0 in | Wt 258.1 lb

## 2014-12-29 DIAGNOSIS — E119 Type 2 diabetes mellitus without complications: Secondary | ICD-10-CM | POA: Diagnosis not present

## 2014-12-29 DIAGNOSIS — M25569 Pain in unspecified knee: Secondary | ICD-10-CM | POA: Diagnosis not present

## 2014-12-29 NOTE — Patient Instructions (Signed)
Work on your meal schedule in the meantime.  Physical with labs ahead of time in about mid April of 2017.  If your sugar is up in the meantime, then let me know.  We can always recheck your earlier.   I would try icing your knee in the meantime.   Take care.  Glad to see you.

## 2014-12-29 NOTE — Progress Notes (Signed)
Pre visit review using our clinic review tool, if applicable. No additional management support is needed unless otherwise documented below in the visit note.  He has been busy at work and family commitments.   Diet is affected, with delay in meals due to work, then relative overeating.  DM2 f/u.  A1c up some, still >7 but last 2 values <8.  Still on metformin. No ADE on med.  D/w pt about options.  Diet is the main issue for patient currently, with improvement his A1c would likely improve.  He agrees.  He isn't having lows or foot troubles.   He is back in the gym, encouraged.  Eye exam d/w pt, due and pending.    Flu shot done at work.   HIV prev neg with HCV w/u, done about 3 years ago.    He has had some gout pain.  Drinking some cherry juice for that.   Medial L knee pain.  ttp locally.  Not iced yet.  No trauma.    Meds, vitals, and allergies reviewed.   ROS: See HPI.  Otherwise, noncontributory.  GEN: nad, alert and oriented HEENT: mucous membranes moist NECK: supple w/o LA CV: rrr. PULM: ctab, no inc wob ABD: soft, +bs EXT: no edema L knee with crepitus and pain on medially joint line. No locking. No ttp laterally.

## 2015-01-01 DIAGNOSIS — M25569 Pain in unspecified knee: Secondary | ICD-10-CM | POA: Insufficient documentation

## 2015-01-01 NOTE — Assessment & Plan Note (Signed)
Likely OA flare, would be reasonable to try icing and then update me as needed.  He agrees.

## 2015-01-01 NOTE — Assessment & Plan Note (Signed)
A1c up some, still >7 but last 2 values <8.  Still on metformin. No ADE on med.  D/w pt about options.  Diet is the main issue for patient currently, with improvement his A1c would likely improve.  He agrees.  Eye exam d/w pt, due and pending.   Recheck in a few months.

## 2015-02-21 NOTE — Progress Notes (Signed)
Patient ID: Alexander [name], male   DOB: 06-23-1950, 65 y.o.   MRN: [id number]    64 y.o.  referred by anesthesia Dr Al Corpus for abnormal ECG 12/2013  . Was preop  right knee arthroscopy with partial medial meniscectomy.  Reviewed ECG from 12/22/13  SR rate 6  LAD IMI inferolateral T wave changes   Quit smoking in 80's  Has HTN  Not on statin BS poorly controlled and A1c chronically runs 7.5 range last checked 10/15  LDL is 138  He sees hepatology at [facility] for cirrhosis   Has hepatitis C previous endocscopy negative for varices PLTls chronically low 119-139   He injured knee doing patio work.  He denies dyspnea chest pain or palpitations Denies history of MI.  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  All other systems reviewed and negative   General: Affect appropriate Overweight black male  HEENT: normal Neck supple with no adenopathy JVP normal no bruits no thyromegaly Lungs clear with no wheezing and good diaphragmatic motion Heart:  S1/S2 no murmur,rub, gallop or click PMI normal Abdomen: benighn, BS positve, no tenderness, no AAA no bruit.  No HSM or HJR Distal pulses intact with no bruits No edema Neuro non-focal Skin warm and dry No muscular weakness  Medications Current Outpatient Prescriptions  Medication Sig Dispense Refill  . amLODipine (NORVASC) 5 MG tablet Take 1 tablet (5 mg total) by mouth every evening. 90 tablet 3  . aspirin 81 MG tablet Take 81 mg by mouth daily.    Marland Kitchen etodolac (LODINE) 500 MG tablet   3  . fexofenadine (ALLEGRA) 180 MG  tablet Take 180 mg by mouth daily.    Marland Kitchen glucose blood (ONE TOUCH ULTRA TEST) test strip Use daily as needed for diabetes 100 each 12  . Lancets (ONETOUCH ULTRASOFT) lancets Use as instructed daily to check sugar. 100 each 12  . levocetirizine (XYZAL) 5 MG tablet   0  . lisinopril (PRINIVIL,ZESTRIL) 40 MG tablet TAKE 1 TABLET (40 MG TOTAL) BY MOUTH DAILY. 90 tablet 3  . metFORMIN (GLUCOPHAGE) 1000 MG tablet Take 1 tablet (1,000 mg total) by mouth 2 (two) times daily. 180 tablet 3  . metoprolol succinate (TOPROL-XL) 100 MG 24 hr tablet Take 1 tablet (100 mg total) by mouth daily. 90 tablet 3  . montelukast (SINGULAIR) 10 MG tablet Take 10 mg by mouth at bedtime.    . Multiple Vitamin (MULTIVITAMIN) tablet Take 1 tablet by mouth daily.      . ONE TOUCH ULTRA TEST test strip USE DAILY AS NEEDED FOR DIABETES 100 each 3  . valsartan-hydrochlorothiazide (DIOVAN-HCT) 320-25 MG per tablet Take 1 tablet by mouth daily. 90 tablet 3   No current facility-administered medications for this visit.    Allergies Clonidine derivatives  Family History: Family History  Problem Relation Age of Onset  . Hypertension Mother   . Stroke Mother   . Cancer Father     stomach CA  . Hypertension Father   . Hypertension Sister   . Diabetes Sister   . Hypertension Brother   . Prostate cancer  Neg Hx   . Colon cancer Neg Hx     Social History: Social History   Social History  . Marital Status: Married    Spouse Name: N/A  . Number of Children: N/A  . Years of Education: N/A   Occupational History  . Not on file.   Social History Main Topics  . Smoking status: Former Smoker    Quit date: 02/10/1978  . Smokeless tobacco: Never Used  . Alcohol Use: No  . Drug Use: No  . Sexual Activity: Not on file   Other Topics Concern  . Not on file   Social History Narrative   Married 1978   Chaplain, at [employer].      Past Surgical History  Procedure Laterality Date  . Thyroid lobectomy   2005    right  . Esophagogastroduodenoscopy      multiple  . Colonoscopy      Past Medical History  Diagnosis Date  . 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 Chronic hepatitis C without mention of hepatic coma     treatment at [place]  . 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 Diabetes mellitus     type II  . Hypertension   . Allergy   . Full dentures   . Wears glasses     Electrocardiogram:  11/02/13  SR rate 85  IMI early R wave transition ? Posterior MI  ECG 12/22/13  shows more inferolateral T wave changes compared  Assessment and Plan Abnormal Myovue: Aortic Valve HTN: DM:  Jenkins Rouge

## 2015-02-22 ENCOUNTER — Encounter: Payer: BLUE CROSS/BLUE SHIELD | Admitting: Cardiovascular Disease

## 2015-04-02 NOTE — Progress Notes (Signed)
Patient ID: Alexander [name], male   DOB: [birth year], 65 y.o.   MRN: [id number]    64 y.o.  referred by anesthesia Dr Al Corpus for abnormal ECG 12/2013  . Needed right knee arthroscopy with partial medial meniscectomy.  Reviewed ECG from 12/22/13  SR rate 60  LAD IMI inferolateral T wave changes   Quit smoking in 80's  Has HTN  Not on statin BS poorly controlled and A1c chronically runs 7.5 range last checked 10/15  LDL is 138  He sees hepatology at [facility] for cirrhosis   Has hepatitis C previous endocscopy negative for varices PLTls chronically low 119-139   He injured knee doing patio work.  He denies dyspnea chest pain or palpitations Denies history of MI.  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 Bicuspid no stenosis trivial AR normal EF 55-60%  [name].  Wife has heart issues and sees Daneen Schick Works out at [employer] 3-4 x/ week lost about 10 lbs    ROS: Denies fever, malais, weight loss, blurry vision, decreased visual acuity, cough, sputum, SOB, hemoptysis, pleuritic pain, palpitaitons, heartburn, abdominal pain, melena, lower extremity edema, claudication, or rash.  All other systems reviewed and negative   General: Affect appropriate Overweight black male  HEENT: normal Neck supple with no adenopathy JVP normal no bruits no thyromegaly Lungs clear with no wheezing and good diaphragmatic motion Heart:  S1/S2 SEM murmur,rub, gallop or click PMI normal Abdomen: benighn, BS positve, no tenderness, no AAA no bruit.  No HSM or HJR Distal pulses intact with no bruits No edema Neuro non-focal Skin warm and dry No muscular weakness  Medications Current Outpatient Prescriptions  Medication Sig Dispense Refill  . amLODipine (NORVASC) 5 MG tablet Take 1 tablet (5 mg total) by mouth every evening. 90 tablet 3  .  aspirin 81 MG tablet Take 81 mg by mouth daily.    . cholecalciferol (VITAMIN D) 1000 units tablet Take 1,000 Units by mouth daily.    Marland Kitchen etodolac (LODINE) 500 MG tablet   3  . fexofenadine (ALLEGRA) 180 MG tablet Take 180 mg by mouth daily.    . fexofenadine-pseudoephedrine (ALLEGRA-D) 60-120 MG 12 hr tablet Take 1 tablet by mouth daily.    Marland Kitchen glucose blood (ONE TOUCH ULTRA TEST) test strip Use daily as needed for diabetes 100 each 12  . Lancets (ONETOUCH ULTRASOFT) lancets Use as instructed daily to check sugar. 100 each 12  . levocetirizine (XYZAL) 5 MG tablet   0  . lisinopril (PRINIVIL,ZESTRIL) 40 MG tablet TAKE 1 TABLET (40 MG TOTAL) BY MOUTH DAILY. 90 tablet 3  . metFORMIN (GLUCOPHAGE) 1000 MG tablet Take 1 tablet (1,000 mg total) by mouth 2 (two) times daily. 180 tablet 3  . metoprolol succinate (TOPROL-XL) 100 MG 24 hr tablet Take 1 tablet (100 mg total) by mouth daily. 90 tablet 3  . montelukast (SINGULAIR) 10 MG tablet Take 10 mg by mouth at bedtime.    . Multiple Vitamin (MULTIVITAMIN) tablet Take 1 tablet by mouth daily.      . Olopatadine HCl 0.6 % SOLN Place 1 spray into the nose daily.    . ONE TOUCH ULTRA TEST test strip USE DAILY AS NEEDED FOR DIABETES 100 each 3  . valsartan-hydrochlorothiazide (DIOVAN-HCT) 320-25 MG per tablet Take 1 tablet by mouth  daily. 90 tablet 3   No current facility-administered medications for this visit.    Allergies Clonidine derivatives  Family History: Family History  Problem Relation Age of Onset  . Hypertension Mother   . Stroke Mother   . Cancer Father     stomach CA  . Hypertension Father   . Hypertension Sister   . Diabetes Sister   . Hypertension Brother   . Prostate cancer Neg Hx   . Colon cancer Neg Hx     Social History: Social History   Social History  . Marital Status: Married    Spouse Name: N/A  . Number of Children: N/A  . Years of Education: N/A   Occupational History  . Not on file.   Social History Main  Topics  . Smoking status: Former Smoker    Quit date: 02/10/1978  . Smokeless tobacco: Never Used  . Alcohol Use: No  . Drug Use: No  . Sexual Activity: Not on file   Other Topics Concern  . 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      Past Surgical History  Procedure Laterality Date  . Thyroid lobectomy  2005    right  . Esophagogastroduodenoscopy      multiple  . Colonoscopy      Past Medical History  Diagnosis Date  . 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 Chronic hepatitis C without mention of hepatic coma     treatment at [place]  . 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 Diabetes mellitus     type II  . Hypertension   . Allergy   . Full dentures   . Wears glasses     Electrocardiogram:  11/02/13  SR rate 85  IMI early R wave transition ? Posterior MI  ECG 11/12 shows more inferolateral T wave changes compared 04/09/15  SR rate 66 LVH ? Old IMI   Assessment and Plan  Abnormal ECG chronic no evidence of MI or structural abnormality on echo/ myovue AV Disease no change in murmur f/u echo11/2017 HTN: Well controlled.  Continue current medications and low sodium Dash type diet.   DM: Discussed low carb diet.  Target hemoglobin A1c is 6.5 or less.  Continue current medications.    Jenkins Rouge

## 2015-04-09 ENCOUNTER — Ambulatory Visit (INDEPENDENT_AMBULATORY_CARE_PROVIDER_SITE_OTHER): Payer: BLUE CROSS/BLUE SHIELD | Admitting: Cardiovascular Disease

## 2015-04-09 VITALS — BP 148/90 | HR 76 | Resp 18 | Ht 75.0 in | Wt 258.0 lb

## 2015-04-09 DIAGNOSIS — Z0000 Encounter for general adult medical examination without abnormal findings: Secondary | ICD-10-CM | POA: Diagnosis not present

## 2015-04-09 DIAGNOSIS — I350 Nonrheumatic aortic (valve) stenosis: Secondary | ICD-10-CM | POA: Diagnosis not present

## 2015-04-09 NOTE — Patient Instructions (Addendum)
Medication Instructions:  Your physician recommends that you continue on your current medications as directed. Please refer to the Current Medication list given to you today.  Labwork: NONE   Testing/Procedures: Your physician has requested that you have an echocardiogram. Echocardiography is a painless test that uses sound waves to create images of your heart. It provides your doctor with information about the size and shape of your heart and how well your heart's chambers and valves are working. This procedure takes approximately one hour. There are no restrictions for this procedure.  Follow-Up: Your physician wants you to follow-up in: 9 months with Dr. Johnsie Cancel. You will receive a reminder letter in the mail two months in advance. If you don't receive a letter, please call our office to schedule the follow-up appointment.  If you need a refill on your cardiac medications before your next appointment, please call your pharmacy.

## 2015-04-11 ENCOUNTER — Other Ambulatory Visit: Payer: Self-pay | Admitting: *Deleted

## 2015-04-11 MED ORDER — GLUCOSE BLOOD VI STRP: ORAL_STRIP | Status: DC

## 2015-04-11 NOTE — Telephone Encounter (Signed)
Received faxed refill request from pharmacy for test strips. Refill sent to pharmacy electronically.

## 2015-05-01 ENCOUNTER — Encounter: Payer: Self-pay | Admitting: Family Medicine

## 2015-05-01 ENCOUNTER — Ambulatory Visit (INDEPENDENT_AMBULATORY_CARE_PROVIDER_SITE_OTHER): Payer: BLUE CROSS/BLUE SHIELD | Admitting: Family Medicine

## 2015-05-01 VITALS — BP 142/84 | HR 73 | Temp 98.1°F | Wt 260.0 lb

## 2015-05-01 DIAGNOSIS — E119 Type 2 diabetes mellitus without complications: Secondary | ICD-10-CM

## 2015-05-01 LAB — COMPREHENSIVE METABOLIC PANEL
ALBUMIN: 4.5 g/dL (ref 3.5–5.2)
ALT: 23 U/L (ref 0–53)
AST: 21 U/L (ref 0–37)
Alkaline Phosphatase: 41 U/L (ref 39–117)
BILIRUBIN TOTAL: 0.5 mg/dL (ref 0.2–1.2)
BUN: 14 mg/dL (ref 6–23)
CALCIUM: 9.3 mg/dL (ref 8.4–10.5)
CO2: 32 mEq/L (ref 19–32)
CREATININE: 0.93 mg/dL (ref 0.40–1.50)
Chloride: 97 mEq/L (ref 96–112)
GFR: 105.12 mL/min (ref >60.00)
Glucose, Bld: 164 mg/dL — ABNORMAL HIGH (ref 70–99)
Potassium: 3.3 mEq/L — ABNORMAL LOW (ref 3.5–5.1)
SODIUM: 138 meq/L (ref 135–145)
Total Protein: 7.7 g/dL (ref 6.0–8.3)

## 2015-05-01 LAB — HEMOGLOBIN A1C: Hgb A1c MFr Bld: 8.5 % — ABNORMAL HIGH (ref 4.6–6.5)

## 2015-05-01 NOTE — Patient Instructions (Addendum)
Go to the lab on the way out.  We'll contact you with your lab report. Call about an eye exam.   Don't change your meds for now.  We'll make plans about follow up when I see your labs.  Take care.  Glad to see you.

## 2015-05-01 NOTE — Progress Notes (Signed)
Pre visit review using our clinic review tool, if applicable. No additional management support is needed unless otherwise documented below in the visit note.  Advance directive d/w pt.  If incapacitated, he would want his wife to speak for him.    Diabetes:  Using medications without difficulties: yes Hypoglycemic episodes: no Hyperglycemic episodes: yes, see below.   Feet problems: L 1st nail thick but no change in sensation.   Blood Sugars averaging: up to 200 in the AMs, occ lower in the AM.  Then down to ~130 later in the day eye exam within last year: due, d/w pt.  He is back in the gym.   He is helping his family, with caring for younger kids.   He is still working at the hospital.   Due for labs, pending.   Meds, vitals, and allergies reviewed.   ROS: See HPI.  Otherwise negative.    GEN: nad, alert and oriented HEENT: mucous membranes moist NECK: supple w/o LA CV: rrr. PULM: ctab, no inc wob ABD: soft, +bs EXT: no edema  Diabetic foot exam: Normal inspection No skin breakdown No calluses  Normal DP pulses Normal sensation to light touch and monofilament Nails normal except for thickened L 1st nail

## 2015-05-02 NOTE — Assessment & Plan Note (Signed)
D/w pt about diet and exercise. No change in meds yet.  See notes on labs.  He agrees. >15 minutes spent in face to face time with patient, >50% spent in counselling or coordination of care

## 2015-05-06 ENCOUNTER — Other Ambulatory Visit: Payer: Self-pay | Admitting: Family Medicine

## 2015-05-06 MED ORDER — GLIMEPIRIDE 2 MG PO TABS: 2.0000 mg | ORAL_TABLET | Freq: Every day | ORAL | Status: DC

## 2015-05-21 DIAGNOSIS — R030 Elevated blood-pressure reading, without diagnosis of hypertension: Secondary | ICD-10-CM

## 2015-05-21 NOTE — Congregational Nurse Program (Signed)
Congregational Nurse Program Note  Date of Encounter: 05/21/2015  Past Medical History: Past Medical History  Diagnosis Date  . 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 Chronic hepatitis C without mention of hepatic coma     treatment at [place]  . 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 Diabetes mellitus     type II  . Hypertension   . Allergy   . Full dentures   . Wears glasses     Encounter Details:     CNP Questionnaire - 05/21/15 1703    Patient Demographics   Is this a new or existing patient? Existing   Patient is considered a/an Not Applicable   Race African-American/Black   Patient Assistance   Location of Patient Assistance Not Applicable   Patient's financial/insurance status Private Insurance Coverage   Uninsured Patient No   Patient referred to apply for the following financial assistance Not Applicable   Food insecurities addressed Not Applicable   Transportation assistance No   Assistance securing medications No   Educational health offerings Diabetes;Hypertension   Encounter Details   Primary purpose of visit Education/Health Concerns   Does patient have a medical provider? Yes   Patient referred to Establish PCP   Was a mental health screening completed? (GAINS tool) No   Does patient have dental issues? No   Does patient have vision issues? No   Since previous encounter, have you referred patient for abnormal blood pressure that resulted in a new diagnosis or medication change? No   Since previous encounter, have you referred patient for abnormal blood glucose that resulted in a new diagnosis or medication change? No   For Abstraction Use Only   Does patient have insurance? Yes       S- Alexander [name] had his blood sugar and blood pressure checked at [facility] by the CN O- his blood sugar reading was 118 and his blood pressure reading was 146/90 A- The Blood sugar was normal and his blood  pressure was a little high ,referred to his PCP

## 2015-06-01 ENCOUNTER — Encounter: Payer: Self-pay | Admitting: Family Medicine

## 2015-06-01 ENCOUNTER — Ambulatory Visit (INDEPENDENT_AMBULATORY_CARE_PROVIDER_SITE_OTHER): Payer: BLUE CROSS/BLUE SHIELD | Admitting: Family Medicine

## 2015-06-01 ENCOUNTER — Telehealth: Payer: Self-pay | Admitting: Family Medicine

## 2015-06-01 VITALS — BP 146/92 | HR 63 | Temp 97.6°F | Ht 74.5 in | Wt 256.0 lb

## 2015-06-01 DIAGNOSIS — Z0000 Encounter for general adult medical examination without abnormal findings: Secondary | ICD-10-CM | POA: Diagnosis not present

## 2015-06-01 DIAGNOSIS — E119 Type 2 diabetes mellitus without complications: Secondary | ICD-10-CM

## 2015-06-01 DIAGNOSIS — I10 Essential (primary) hypertension: Secondary | ICD-10-CM

## 2015-06-01 DIAGNOSIS — B182 Chronic viral hepatitis C: Secondary | ICD-10-CM

## 2015-06-01 MED ORDER — AMLODIPINE BESYLATE 5 MG PO TABS: 5.0000 mg | ORAL_TABLET | Freq: Every evening | ORAL | Status: DC

## 2015-06-01 MED ORDER — LISINOPRIL 40 MG PO TABS: ORAL_TABLET | ORAL | Status: DC

## 2015-06-01 MED ORDER — VALSARTAN-HYDROCHLOROTHIAZIDE 320-25 MG PO TABS: 1.0000 | ORAL_TABLET | Freq: Every day | ORAL | Status: DC

## 2015-06-01 MED ORDER — ONETOUCH ULTRASOFT LANCETS MISC: Status: DC

## 2015-06-01 MED ORDER — METFORMIN HCL 1000 MG PO TABS: 1000.0000 mg | ORAL_TABLET | Freq: Two times a day (BID) | ORAL | Status: DC

## 2015-06-01 MED ORDER — METOPROLOL SUCCINATE ER 100 MG PO TB24: 100.0000 mg | ORAL_TABLET | Freq: Every day | ORAL | Status: DC

## 2015-06-01 NOTE — Progress Notes (Signed)
Pre visit review using our clinic review tool, if applicable. No additional management support is needed unless otherwise documented below in the visit note.  CPE- See plan.  Routine anticipatory guidance given to patient.  See health maintenance. Tetanus 2012 Flu done at work  Shingles- d/w pt.  PNA shot prev done. Colonoscopy prev done by GI 2015 Prostate cancer screening and PSA options (with potential risks and benefits of testing vs not testing) were discussed along with recent recs/guidelines. He declined testing PSA at this point.   He'll consider if he wants it done in the future.   Living will d/w pt. He's working on this. Wife designated if patient were incapacitated.  Diet and exercise d/w pt.  Encouraged.    He has has f/u re: his L knee pending.    HCV, hx of.  Not on treatment now, undetectable viral load after treatment.  MRI liver done 09/2014 per GI with plan for f/u later this year.    Hypertension:  Using medication without problems or lightheadedness: yes Chest pain with exertion:no Edema:no Short of breath:no  DM2.  His sugar is better on glimepiride, usually ~130 in the AMs, with hypoglycemia cautions.  Not yet due for A1c.  He has been careful to avoid prolonged fasting.   D/w pt about diet and exercise.  Eye exam d/w pt, due.   He is going to f/u with the allergy clinic later today.    PMH and SH reviewed  ROS: See HPI, otherwise noncontributory.  Meds, vitals, and allergies reviewed.   GEN: nad, alert and oriented HEENT: mucous membranes moist NECK: supple w/o LA CV: rrr PULM: ctab, no inc wob ABD: soft, +bs EXT: no edema SKIN: no acute rash

## 2015-06-01 NOTE — Telephone Encounter (Signed)
Pt made 3 month follow up 7/24.  He stated he would like to have his labs done at the [place] office.  Need orders in system

## 2015-06-01 NOTE — Telephone Encounter (Signed)
Ordered a1c. Thanks.

## 2015-06-01 NOTE — Patient Instructions (Addendum)
Check with your insurance to see if they will cover the shingles shot. Call about an eye exam.   Call GI if you don't hear from them in the fall.   Exercise as your knee and schedule allow Take care.  Glad to see you.  Recheck A1c in about 3 months at or before a visit.

## 2015-06-03 DIAGNOSIS — R7309 Other abnormal glucose: Secondary | ICD-10-CM

## 2015-06-03 NOTE — Assessment & Plan Note (Signed)
MRI liver done 09/2014 per GI with plan for f/u later this year.  App GI input.

## 2015-06-03 NOTE — Assessment & Plan Note (Signed)
His sugar is better on glimepiride, usually ~130 in the AMs, with hypoglycemia cautions. Not yet due for A1c. He has been careful to avoid prolonged fasting. D/w pt about diet and exercise. Eye exam d/w pt, due.  Continue meds as is for now.  He agree.

## 2015-06-03 NOTE — Assessment & Plan Note (Signed)
He'll continue meds as is, with work on diet and exercise d/w pt.

## 2015-06-03 NOTE — Assessment & Plan Note (Signed)
Tetanus 2012 Flu done at work  Shingles- d/w pt.  PNA shot prev done. Colonoscopy prev done by GI 2015 Prostate cancer screening and PSA options (with potential risks and benefits of testing vs not testing) were discussed along with recent recs/guidelines. He declined testing PSA at this point.   He'll consider if he wants it done in the future.   Living will d/w pt. He's working on this. Wife designated if patient were incapacitated.  Diet and exercise d/w pt.  Encouraged.

## 2015-06-03 NOTE — Congregational Nurse Program (Signed)
Congregational Nurse Program Note  Date of Encounter: 06/03/2015  Past Medical History: Past Medical History  Diagnosis Date  . 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 Chronic hepatitis C without mention of hepatic coma     treatment at [place]  . 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 Diabetes mellitus     type II  . Hypertension   . Allergy   . Full dentures   . Wears glasses     Encounter Details:     CNP Questionnaire - 06/03/15 1614    Patient Demographics   Is this a new or existing patient? Existing   Patient is considered a/an Not Applicable   Race African-American/Black   Patient Assistance   Location of Patient Assistance Not Applicable   Patient's financial/insurance status Private Insurance Coverage   Uninsured Patient No   Patient referred to apply for the following financial assistance Not Applicable   Food insecurities addressed Not Applicable   Transportation assistance No   Assistance securing medications No   Educational health offerings Diabetes   Encounter Details   Primary purpose of visit Education/Health Concerns   Was an Emergency Department visit averted? No   Does patient have a medical provider? Yes   Patient referred to Establish PCP   Was a mental health screening completed? (GAINS tool) No   Does patient have dental issues? No   Does patient have vision issues? No   Since previous encounter, have you referred patient for abnormal blood pressure that resulted in a new diagnosis or medication change? No   Since previous encounter, have you referred patient for abnormal blood glucose that resulted in a new diagnosis or medication change? Yes   For Abstraction Use Only   Does patient have insurance? Yes       S- Alexander [name] had his blood sugar checked at [facility] today by the CN  O- He was feeling as if his glucose level might be low and it was 69 when tested  A- Abnormal  low reading of his blood sugar  P-Recently placed on new medication which he took this morning ,stated that he  does not feel like eating ,recommended that he carry a snack and eat something every few hours to maintain a healthy blood sugar level .

## 2015-07-02 ENCOUNTER — Other Ambulatory Visit: Payer: Self-pay | Admitting: Family Medicine

## 2015-07-02 NOTE — Congregational Nurse Program (Signed)
Congregational Nurse Program Note  Date of Encounter: 06/03/2015  Past Medical History: Past Medical History  Diagnosis Date  . 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 Chronic hepatitis C without mention of hepatic coma     treatment at [place] [place]  . 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 Diabetes mellitus     type II  . Hypertension   . Allergy   . Full dentures   . Wears glasses     Encounter Details:     CNP Questionnaire - 07/02/15 1259    Patient Demographics   Is this a new or existing patient? Existing   Patient is considered a/an Not Applicable   Race African-American/Black   Patient Assistance   Location of Patient Assistance Not Applicable   Patient's financial/insurance status Private Insurance Coverage   Uninsured Patient No   Patient referred to apply for the following financial assistance Not Applicable   Food insecurities addressed Not Applicable   Transportation assistance No   Assistance securing medications No   Educational health offerings Acute disease   Encounter Details   Primary purpose of visit Chronic Illness/Condition Visit   Was an Emergency Department visit averted? No   Does patient have a medical provider? Yes   Patient referred to Establish PCP   Was a mental health screening completed? (GAINS tool) No   Does patient have dental issues? No   Does patient have vision issues? No   Does your patient have an abnormal blood pressure today? No   Since previous encounter, have you referred patient for abnormal blood pressure that resulted in a new diagnosis or medication change? No   Does your patient have an abnormal blood glucose today? Yes   Since previous encounter, have you referred patient for abnormal blood glucose that resulted in a new diagnosis or medication change? Yes   Was there a life-saving intervention made? No   For Abstraction Use Only   Was the patient insured? Yes        [name] was seen by the CN at [facility]  O- He told me he felt like his blood sugar was low and need it to be checked . A- Blood sugar was 132 after he drank a cup of grape juice  P- Recommended he carry something sweet with him and self test his blood sugar whenever he feels like it might be low.

## 2015-08-18 DIAGNOSIS — M25572 Pain in left ankle and joints of left foot: Secondary | ICD-10-CM

## 2015-08-18 NOTE — Congregational Nurse Program (Unsigned)
Congregational Nurse Program Note  Date of Encounter: 08/18/2015  Past Medical History: Past Medical History  Diagnosis Date  . 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 Chronic hepatitis C without mention of hepatic coma     treatment at [place]  . 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 Diabetes mellitus     type II  . Hypertension   . Allergy   . Full dentures   . Wears glasses     Encounter Details:     CNP Questionnaire - 08/18/15 1046    Patient Demographics   Race African-American/Black   Patient Assistance   Location of Patient Assistance Not Applicable   Patient's financial/insurance status Private Insurance Coverage   Uninsured Patient No   Patient referred to apply for the following financial assistance Not Applicable   Food insecurities addressed Not Applicable   Transportation assistance No   Assistance securing medications No   Educational health offerings Acute disease   Encounter Details   Primary purpose of visit Acute Illness/Condition Visit   Was an Emergency Department visit averted? No   Does patient have a medical provider? Yes   Was a mental health screening completed? (GAINS tool) No   Does patient have dental issues? No   Does patient have vision issues? No   Does your patient have an abnormal blood pressure today? No   Since previous encounter, have you referred patient for abnormal blood pressure that resulted in a new diagnosis or medication change? No   Does your patient have an abnormal blood glucose today? No   Since previous encounter, have you referred patient for abnormal blood glucose that resulted in a new diagnosis or medication change? No   Was there a life-saving intervention made? No   For Abstraction Use Only   Was the patient insured? No       CPN Note  S- [name]  O- Alexander [name] was limping and unable to bear weight on his  left foot Thelma Barge A- Alexander [name] was diagnosed with Gout and being treated with medication  P-Alexander [name] reports the pain is not as strong today and tries not to put all his weight on left foot

## 2015-08-28 ENCOUNTER — Telehealth: Payer: Self-pay | Admitting: Family Medicine

## 2015-08-28 NOTE — Telephone Encounter (Signed)
Pt is requesting order sent over to [facility] office for 3 mo follow up labs. He would like to go by tomorrow 7/19.

## 2015-08-28 NOTE — Telephone Encounter (Signed)
A1c order was already in.  Should be good to go. Thanks.

## 2015-08-28 NOTE — Telephone Encounter (Signed)
Patient advised.

## 2015-08-29 ENCOUNTER — Other Ambulatory Visit (INDEPENDENT_AMBULATORY_CARE_PROVIDER_SITE_OTHER): Payer: BLUE CROSS/BLUE SHIELD

## 2015-08-29 DIAGNOSIS — E119 Type 2 diabetes mellitus without complications: Secondary | ICD-10-CM

## 2015-08-29 LAB — HEMOGLOBIN A1C: Hgb A1c MFr Bld: 7.1 % — ABNORMAL HIGH (ref 4.6–6.5)

## 2015-09-03 ENCOUNTER — Ambulatory Visit (INDEPENDENT_AMBULATORY_CARE_PROVIDER_SITE_OTHER): Payer: BLUE CROSS/BLUE SHIELD | Admitting: Family Medicine

## 2015-09-03 ENCOUNTER — Encounter: Payer: Self-pay | Admitting: Family Medicine

## 2015-09-03 DIAGNOSIS — E119 Type 2 diabetes mellitus without complications: Secondary | ICD-10-CM | POA: Diagnosis not present

## 2015-09-03 MED ORDER — ONETOUCH DELICA LANCETS 33G MISC: 3 refills | Status: AC

## 2015-09-03 NOTE — Patient Instructions (Addendum)
Recheck in early November, labs ahead of time.   I'll await your eye exam notes.   Skip the glimepiride if needed, but try to schedule your meals (or at least a snack). Take care.  Glad to see you.  Update me as needed.

## 2015-09-03 NOTE — Assessment & Plan Note (Signed)
Improved A1c, continue meds as is except for change to delica lancets per patient request and to skip glimepiride if lower sugars.  He agrees.  He'll work on diet and exercise as best he can.  Recheck A1c in about 4 months.  Update me as needed.  Eye exam pending. He agrees.

## 2015-09-03 NOTE — Progress Notes (Signed)
Pre visit review using our clinic review tool, if applicable. No additional management support is needed unless otherwise documented below in the visit note.  Diabetes:  Using medications without difficulties:yes Hypoglycemic episodes:lowest has been 80s, not urgent values.   Hyperglycemic episodes:no Feet problems:no Blood Sugars averaging: ~140 in the AMs.  eye exam within last year: due, d/w pt.  Pending for this week.   A1c improved, d/w pt.   Meds, vitals, and allergies reviewed.   ROS: Per HPI unless specifically indicated in ROS section   GEN: nad, alert and oriented HEENT: mucous membranes moist NECK: supple w/o LA CV: rrr. PULM: ctab, no inc wob ABD: soft, +bs EXT: trace edema L ankle (patient had noted some in summer, usually not on the R ankle, mild sx, resolves with elevation or stockings per patient report)  Diabetic foot exam: Normal inspection No skin breakdown No calluses  Normal DP pulses Normal sensation to light touch and monofilament Nails normal except for L 1st nail thickened.

## 2015-09-15 ENCOUNTER — Other Ambulatory Visit: Payer: Self-pay | Admitting: Family Medicine

## 2015-09-16 NOTE — Telephone Encounter (Signed)
Sent. Thanks.   

## 2015-09-17 ENCOUNTER — Other Ambulatory Visit: Payer: Self-pay

## 2015-09-17 ENCOUNTER — Ambulatory Visit (HOSPITAL_COMMUNITY): Payer: BLUE CROSS/BLUE SHIELD | Attending: Cardiovascular Disease

## 2015-09-17 DIAGNOSIS — I351 Nonrheumatic aortic (valve) insufficiency: Secondary | ICD-10-CM | POA: Diagnosis not present

## 2015-09-17 DIAGNOSIS — E119 Type 2 diabetes mellitus without complications: Secondary | ICD-10-CM | POA: Diagnosis not present

## 2015-09-17 DIAGNOSIS — I340 Nonrheumatic mitral (valve) insufficiency: Secondary | ICD-10-CM | POA: Insufficient documentation

## 2015-09-17 DIAGNOSIS — I350 Nonrheumatic aortic (valve) stenosis: Secondary | ICD-10-CM | POA: Diagnosis not present

## 2015-09-17 DIAGNOSIS — I358 Other nonrheumatic aortic valve disorders: Secondary | ICD-10-CM | POA: Diagnosis not present

## 2015-09-17 DIAGNOSIS — I119 Hypertensive heart disease without heart failure: Secondary | ICD-10-CM | POA: Insufficient documentation

## 2015-09-17 DIAGNOSIS — I071 Rheumatic tricuspid insufficiency: Secondary | ICD-10-CM | POA: Diagnosis not present

## 2015-09-23 LAB — HM DIABETES EYE EXAM

## 2015-09-28 ENCOUNTER — Other Ambulatory Visit: Payer: Self-pay | Admitting: Family Medicine

## 2015-10-09 ENCOUNTER — Telehealth: Payer: Self-pay

## 2015-10-09 NOTE — Telephone Encounter (Signed)
-----   Message from Barron Alvine, RN sent at 10/05/2015  9:28 AM EDT -----   ----- Message ----- From: Marlon Pel, RN Sent: 10/05/2015 To: Marlon Pel, RN  Needs [place] in 1 year- see MRI results from 10/04/14- Carlean Purl

## 2015-10-09 NOTE — Telephone Encounter (Signed)
Left message for patient to call back  

## 2015-10-11 NOTE — Telephone Encounter (Signed)
Left message for patient to call back  

## 2015-10-12 NOTE — Telephone Encounter (Signed)
No return call from the patient. I have mailed him a letter.

## 2015-11-02 ENCOUNTER — Ambulatory Visit
Admission: RE | Admit: 2015-11-02 | Discharge: 2015-11-02 | Disposition: A | Discharge status: Home / Self Care | Payer: BLUE CROSS/BLUE SHIELD | Source: Ambulatory Visit | Attending: Physical Medicine and Rehabilitation | Admitting: Physical Medicine and Rehabilitation

## 2015-11-02 ENCOUNTER — Other Ambulatory Visit: Payer: Self-pay | Admitting: Physical Medicine and Rehabilitation

## 2015-11-02 DIAGNOSIS — M79672 Pain in left foot: Secondary | ICD-10-CM

## 2015-12-24 ENCOUNTER — Encounter: Payer: Self-pay | Admitting: Family Medicine

## 2015-12-24 ENCOUNTER — Ambulatory Visit (INDEPENDENT_AMBULATORY_CARE_PROVIDER_SITE_OTHER): Payer: BLUE CROSS/BLUE SHIELD | Admitting: Family Medicine

## 2015-12-24 VITALS — BP 158/98 | HR 59 | Temp 98.0°F | Wt 265.0 lb

## 2015-12-24 DIAGNOSIS — R05 Cough: Secondary | ICD-10-CM

## 2015-12-24 DIAGNOSIS — R059 Cough, unspecified: Secondary | ICD-10-CM

## 2015-12-24 DIAGNOSIS — E785 Hyperlipidemia, unspecified: Secondary | ICD-10-CM | POA: Diagnosis not present

## 2015-12-24 DIAGNOSIS — I10 Essential (primary) hypertension: Secondary | ICD-10-CM

## 2015-12-24 DIAGNOSIS — E119 Type 2 diabetes mellitus without complications: Secondary | ICD-10-CM

## 2015-12-24 LAB — BASIC METABOLIC PANEL
BUN: 11 mg/dL (ref 6–23)
CHLORIDE: 101 meq/L (ref 96–112)
CO2: 33 mEq/L — ABNORMAL HIGH (ref 19–32)
Calcium: 8.7 mg/dL (ref 8.4–10.5)
Creatinine, Ser: 0.87 mg/dL (ref 0.40–1.50)
GFR: 113.3 mL/min (ref >60.00)
Glucose, Bld: 128 mg/dL — ABNORMAL HIGH (ref 70–99)
POTASSIUM: 3.4 meq/L — AB (ref 3.5–5.1)
SODIUM: 141 meq/L (ref 135–145)

## 2015-12-24 LAB — LIPID PANEL
CHOLESTEROL: 171 mg/dL (ref 0–200)
HDL: 42.5 mg/dL (ref >39.00)
LDL CALC: 114 mg/dL — AB (ref 0–99)
NonHDL: 128.41
TRIGLYCERIDES: 70 mg/dL (ref 0.0–149.0)
Total CHOL/HDL Ratio: 4
VLDL: 14 mg/dL (ref 0.0–40.0)

## 2015-12-24 LAB — HEMOGLOBIN A1C: Hgb A1c MFr Bld: 7.6 % — ABNORMAL HIGH (ref 4.6–6.5)

## 2015-12-24 NOTE — Progress Notes (Signed)
Diabetes:  Using medications without difficulties: yes Hypoglycemic episodes:no Hyperglycemic episodes: occ elevations Feet problems:no Blood Sugars averaging: 130-150 usually recently eye exam within last year: done about 3 months ago.  Due for labs.  Flu shot done at work prev.   He is going to get back to gym in the near future.    Still with URI sx, has seen allergy clinic.  Has been on abx per allergy clinic recently.  Using net pot.  Some improvement in the meantime.  Still with some sputum production.   Hypertension:    Using medication without problems or lightheadedness: yes Chest pain with exertion:no Edema:no Short of breath:no  Elevated Cholesterol. Not on statin. D/w pt about diet and exercise.  Due for labs.  See notes on labs.   PMH and SH reviewed.   Vital signs, Meds and allergies reviewed.  ROS: Per HPI unless specifically indicated in ROS section   GEN: nad, alert and oriented HEENT: mucous membranes moist, tm wnl, nasal exam stuffy, OP wnl NECK: supple w/o LA CV: rrr. PULM: ctab, no inc wob ABD: soft, +bs EXT: no edema SKIN: no acute rash  Diabetic foot exam: Normal inspection No skin breakdown No calluses  Normal DP pulses Normal sensation to light touch and monofilament Nails thickened B 1st toes.

## 2015-12-24 NOTE — Assessment & Plan Note (Signed)
Recheck labs today. See notes on labs.  D/w pt about diet and exercise, see above.  No on statin at this point.

## 2015-12-24 NOTE — Assessment & Plan Note (Signed)
See notes on labs.  D/w pt about diet and exercise, see above.  Inc metoprolol to 150mg  a day, update me as needed.  He agrees.

## 2015-12-24 NOTE — Assessment & Plan Note (Signed)
See notes on labs.  D/w pt about diet and exercise, see above.  No change in meds at this point.  He agrees.

## 2015-12-24 NOTE — Assessment & Plan Note (Signed)
With recurrent uri sx, d/w pt about f/u with allergy clinic.  He agrees.

## 2015-12-24 NOTE — Patient Instructions (Addendum)
Go to the lab on the way out.  We'll contact you with your lab report. I would go back to 1.5 tabs of metoprolol and update me if your BP isn't controlled.  Recheck in about 3-4 months with labs before a visit.   Take care.  Glad to see you.

## 2015-12-27 ENCOUNTER — Encounter: Payer: Self-pay | Admitting: *Deleted

## 2016-01-01 ENCOUNTER — Telehealth: Payer: Self-pay

## 2016-01-01 MED ORDER — METOPROLOL SUCCINATE ER 100 MG PO TB24: 100.0000 mg | ORAL_TABLET | Freq: Every day | ORAL | Status: DC

## 2016-01-01 NOTE — Telephone Encounter (Signed)
I would try going up to 2 metoprolol tabs a day and see if that helps with BP and the sweats.  Update me if not better.  Thanks.

## 2016-01-01 NOTE — Telephone Encounter (Signed)
Pt left v/m;pt last seen 12/24/15 and increased metoprolol by 1/2 tab. For the last 2 days pt BP has been elevated 150-160/103.Pt is sweating more than normal.pt wants to know what to do. Pt request cb.[facility] [address]

## 2016-01-01 NOTE — Telephone Encounter (Signed)
Patient advised.

## 2016-01-22 ENCOUNTER — Ambulatory Visit (INDEPENDENT_AMBULATORY_CARE_PROVIDER_SITE_OTHER): Payer: Medicare Other | Admitting: Family Medicine

## 2016-01-22 ENCOUNTER — Telehealth: Payer: Self-pay | Admitting: *Deleted

## 2016-01-22 ENCOUNTER — Encounter: Payer: Self-pay | Admitting: Family Medicine

## 2016-01-22 VITALS — BP 148/98 | HR 78 | Temp 98.5°F | Wt 265.2 lb

## 2016-01-22 DIAGNOSIS — R0683 Snoring: Secondary | ICD-10-CM

## 2016-01-22 DIAGNOSIS — I10 Essential (primary) hypertension: Secondary | ICD-10-CM | POA: Diagnosis not present

## 2016-01-22 MED ORDER — AMLODIPINE BESYLATE 5 MG PO TABS: 10.0000 mg | ORAL_TABLET | Freq: Every evening | ORAL | Status: DC

## 2016-01-22 NOTE — Telephone Encounter (Signed)
Spoke to patient and was advised that he did increase his Metoprolol to 2 pills a day and his BP is still not coming down. Patient stated that he occasionally has a slight headache,but stated that may be his sinuses because he does have sinus problems. Patient stated that he is not having any dizziness or any other symptoms. Pharmacy [facility] Dispensing optician.

## 2016-01-22 NOTE — Progress Notes (Signed)
Pre visit review using our clinic review tool, if applicable. No additional management support is needed unless otherwise documented below in the visit note. 

## 2016-01-22 NOTE — Telephone Encounter (Signed)
After speaking with Dr. Damita Dunnings called patient by telephone and advised him to come for appointment this afternoon at 3:45. Patient verbalized understanding.

## 2016-01-22 NOTE — Telephone Encounter (Signed)
Patient left a voicemail stating that his BP has been elevated. Patient stated that it has been running 172/106 and heart rate 55-60. Patient stated that he has been taking an extra Metoprolol. Patient stated that he was hoping to get in to see you today, but no appointments available. Please advise.

## 2016-01-22 NOTE — Telephone Encounter (Signed)
Thanks

## 2016-01-22 NOTE — Patient Instructions (Addendum)
Up the amlodipine to 10mg  a day in the meantime and update me in about 1 week.  Rosaria Ferries will call about your referral. Take care.  Glad to see you.

## 2016-01-22 NOTE — Progress Notes (Signed)
BP elevation.  Had been higher than measured today.  Had been increasing metoprolol, but out caffeine.  Increased metoprolol again in the meantime.  Has been 170s/100 on check this AM.  Still on other baseline BP meds.    He has had some sinus HA recently.  Recently on abx per allergy clinic recently.  He feels better overall from that standpoint, doesn't feel sick now.    He thinks his BP cuff at home is accurate.    He snores a lot.  Unclear if he has OSA.    PMH and SH reviewed  ROS: Per HPI unless specifically indicated in ROS section   Meds, vitals, and allergies reviewed.   GEN: nad, alert and oriented HEENT: mucous membranes moist NECK: supple w/o LA CV: rrr PULM: ctab, no inc wob ABD: soft, +bs EXT: no edema SKIN: no acute rash

## 2016-01-23 ENCOUNTER — Encounter: Payer: Self-pay | Admitting: Family Medicine

## 2016-01-23 DIAGNOSIS — R0683 Snoring: Secondary | ICD-10-CM | POA: Insufficient documentation

## 2016-01-23 NOTE — Assessment & Plan Note (Signed)
Uncontrolled, worsened. Previous creatinine reviewed, at baseline, stable. Discussed with patient about previous creatinine. Compliant with meds. Increase amlodipine to 10 mg at this point. Update me with about his blood pressure in a few days. Unclear if he has obstructive sleep apnea. This could worsen his diabetes and his hypertension. Discussed with patient. He does snore a lot. Reasonable to refer to pulmonary for evaluation, testing. He agrees. Okay for outpatient follow-up.

## 2016-01-23 NOTE — Assessment & Plan Note (Signed)
Unclear if he has obstructive sleep apnea. Discussed with patient. Refer to pulmonary for evaluation and testing. He agrees.

## 2016-03-17 ENCOUNTER — Other Ambulatory Visit: Payer: Self-pay | Admitting: *Deleted

## 2016-03-17 MED ORDER — METOPROLOL SUCCINATE ER 100 MG PO TB24: 100.0000 mg | ORAL_TABLET | Freq: Every day | ORAL | 1 refills | Status: DC

## 2016-03-17 NOTE — Telephone Encounter (Signed)
Rx faxed to [facility] [phone]

## 2016-03-18 ENCOUNTER — Institutional Professional Consult (permissible substitution): Payer: Medicare Other | Admitting: Pulmonary Disease

## 2016-04-01 ENCOUNTER — Other Ambulatory Visit: Payer: Self-pay | Admitting: *Deleted

## 2016-04-01 MED ORDER — VALSARTAN-HYDROCHLOROTHIAZIDE 320-25 MG PO TABS: 1.0000 | ORAL_TABLET | Freq: Every day | ORAL | 1 refills | Status: DC

## 2016-04-02 ENCOUNTER — Other Ambulatory Visit: Payer: Self-pay | Admitting: Family Medicine

## 2016-04-02 DIAGNOSIS — E119 Type 2 diabetes mellitus without complications: Secondary | ICD-10-CM

## 2016-04-03 ENCOUNTER — Other Ambulatory Visit (INDEPENDENT_AMBULATORY_CARE_PROVIDER_SITE_OTHER): Payer: Medicare Other

## 2016-04-03 DIAGNOSIS — E119 Type 2 diabetes mellitus without complications: Secondary | ICD-10-CM | POA: Diagnosis not present

## 2016-04-03 LAB — HEMOGLOBIN A1C: HEMOGLOBIN A1C: 7.4 % — AB (ref 4.6–6.5)

## 2016-04-07 ENCOUNTER — Encounter: Payer: Self-pay | Admitting: Family Medicine

## 2016-04-07 ENCOUNTER — Ambulatory Visit (INDEPENDENT_AMBULATORY_CARE_PROVIDER_SITE_OTHER): Payer: Medicare Other | Admitting: Family Medicine

## 2016-04-07 DIAGNOSIS — E119 Type 2 diabetes mellitus without complications: Secondary | ICD-10-CM | POA: Diagnosis not present

## 2016-04-07 MED ORDER — AMLODIPINE BESYLATE 5 MG PO TABS: 5.0000 mg | ORAL_TABLET | Freq: Every evening | ORAL | 3 refills | Status: DC

## 2016-04-07 NOTE — Patient Instructions (Addendum)
If you get lightheaded, then cut the amlodipine back to 5mg  a day.   Recheck labs before a physical in about 3-4 months.  Take care.  Glad to see you.   PNA-13/prevnar shot at physical.

## 2016-04-07 NOTE — Progress Notes (Signed)
Pre visit review using our clinic review tool, if applicable. No additional management support is needed unless otherwise documented below in the visit note. 

## 2016-04-07 NOTE — Progress Notes (Signed)
Diabetes:  Using medications without difficulties:yes Hypoglycemic episodes:not usually, only if prolonged fasting Hyperglycemic episodes:no Feet problems:no Blood Sugars averaging: usually ~140s or lower eye exam within last year: yes A1c slightly better, d/w pt.    He remains busy with work.   He is back in the gym now and that is helping his sugar.  Weight loss noted.  He is working on his diet.  PNA vaccine d/w pt.    He isn't getting lightheaded.    PMH and SH reviewed  Meds, vitals, and allergies reviewed.   ROS: Per HPI unless specifically indicated in ROS section   GEN: nad, alert and oriented HEENT: mucous membranes moist NECK: supple w/o LA CV: rrr. PULM: ctab, no inc wob ABD: soft, +bs EXT: no edema

## 2016-04-08 NOTE — Assessment & Plan Note (Signed)
A1c improved discussed with patient. His last A1c is amongst the best values he's had in the last few years. Continue work on diet and exercise. No change in medications at this point. Recheck in a few months.  He is not lightheaded, but if he does get lightheaded with continued weight loss from diet and exercise then he can taper his blood pressure medications, but cutting back on his metoprolol and amlodipine if needed. He agrees.

## 2016-04-29 ENCOUNTER — Encounter: Payer: Self-pay | Admitting: Pulmonary Disease

## 2016-04-29 ENCOUNTER — Ambulatory Visit (INDEPENDENT_AMBULATORY_CARE_PROVIDER_SITE_OTHER): Payer: Medicare Other | Admitting: Pulmonary Disease

## 2016-04-29 VITALS — BP 122/76 | HR 75 | Ht 76.0 in | Wt 268.8 lb

## 2016-04-29 DIAGNOSIS — G4710 Hypersomnia, unspecified: Secondary | ICD-10-CM

## 2016-04-29 NOTE — Progress Notes (Signed)
Subjective:    Patient ID: Alexander [name], male    DOB: 1950/09/27, 66 y.o.   MRN: [id number]  HPI    This is the case of Alexander [name], 66 y.o. Male, who was referred by Dr. Elsie Stain  in consultation regarding OSA  As you very well know, patient smoked 3-4 cigs/day for 10 yrs, quit in his 1s. Not known to have asthma or copd.   Patient has snoring, witnessed apneas, has gasping, choking.  Sleeps 7-8 hrs/night. Has mild hypersomnia in am. Has daily naps. (-) abnormal behavior in sleep.  His BP has been elevated at 150s recently.  Diastolic in 536-644 mm Hg.   Hypersomnia affects his funtionality.  ESS 8.    Review of Systems  Constitutional: Negative.  Negative for fever and unexpected weight change.  HENT: Negative.  Negative for congestion, dental problem, ear pain, nosebleeds, postnasal drip, rhinorrhea, sinus pressure, sneezing, sore throat and trouble swallowing.   Eyes: Negative.  Negative for redness and itching.  Respiratory: Negative.  Negative for cough, chest tightness, shortness of breath and wheezing.   Cardiovascular: Negative.  Negative for palpitations and leg swelling.  Gastrointestinal: Negative.  Negative for nausea and vomiting.  Endocrine: Negative.   Genitourinary: Negative.  Negative for dysuria.  Musculoskeletal: Positive for joint swelling.  Skin: Negative.  Negative for rash.  Allergic/Immunologic: Negative.  Negative for environmental allergies, food allergies and immunocompromised state.  Neurological: Negative.  Negative for headaches.  Hematological: Negative.  Does not bruise/bleed easily.  Psychiatric/Behavioral: Negative.  Negative for dysphoric mood. The patient is not nervous/anxious.    Past Medical History:  Diagnosis Date  . Allergy   . Chronic hepatitis C without mention of hepatic coma    treatment at [place]  . 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 Diabetes mellitus    type II  . Full dentures   .  Hypertension   . 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 Wears glasses    (-) CA, DVT  Family History  Problem Relation Age of Onset  . Hypertension Mother   . Stroke Mother   . Cancer Father     stomach CA  . Hypertension Father   . Hypertension Sister   . Diabetes Sister   . Hypertension Brother   . Prostate cancer Neg Hx   . Colon cancer Neg Hx      Past Surgical History:  Procedure Laterality Date  . COLONOSCOPY    . ESOPHAGOGASTRODUODENOSCOPY     multiple  . THYROID LOBECTOMY  2005   right    Social History   Social History  . Marital status: Married    Spouse name: N/A  . Number of children: N/A  . Years of education: N/A   Occupational History  . Not on file.   Social History Main Topics  . Smoking status: Former Smoker    Quit date: 02/10/1978  . Smokeless tobacco: Never Used  . Alcohol use No  . Drug use: No  . Sexual activity: Not on file   Other Topics Concern  . Not on file   Social History Narrative   Married 1978   Clinical biochemist, at [employer] for [employer]. Lives in [place].   Allergies  Allergen Reactions  . Clonidine Derivatives     cramps     Outpatient Medications Prior to Visit  Medication Sig Dispense Refill  . amLODipine (NORVASC) 5 MG tablet  Take 1-2 tablets (5-10 mg total) by mouth every evening. 180 tablet 3  . aspirin 81 MG tablet Take 81 mg by mouth daily.    . fexofenadine (ALLEGRA) 180 MG tablet Take 180 mg by mouth daily as needed.     Marland Kitchen glimepiride (AMARYL) 2 MG tablet Take 1 tablet (2 mg total) by mouth daily before breakfast. 90 tablet 3  . glucose blood (ONE TOUCH ULTRA TEST) test strip Use daily as needed for diabetes 100 each 1  . levocetirizine (XYZAL) 5 MG tablet Take 5 mg by mouth at bedtime as needed.   0  . lisinopril (PRINIVIL,ZESTRIL) 40 MG tablet TAKE 1 TABLET (40 MG TOTAL) BY MOUTH DAILY. 90 tablet 3  . metFORMIN (GLUCOPHAGE) 1000 MG tablet Take 1 tablet (1,000 mg total) by mouth 2 (two) times daily. 180  tablet 3  . metoprolol succinate (TOPROL-XL) 100 MG 24 hr tablet Take 1-2 tablets (100-200 mg total) by mouth daily. Take with or immediately following a meal 180 tablet 1  . montelukast (SINGULAIR) 10 MG tablet Take 10 mg by mouth at bedtime as needed.     . Multiple Vitamin (MULTIVITAMIN) tablet Take 1 tablet by mouth daily.      . ONE TOUCH ULTRA TEST test strip USE DAILY AS NEEDED FOR DIABETES 100 each 3  . ONETOUCH DELICA LANCETS 29H MISC Use daily to check sugar as needed.  Dx E11.9 100 each 3  . valsartan-hydrochlorothiazide (DIOVAN-HCT) 320-25 MG tablet Take 1 tablet by mouth daily. 90 tablet 1  . cholecalciferol (VITAMIN D) 1000 units tablet Take 1,000 Units by mouth daily.     No facility-administered medications prior to visit.    No orders of the defined types were placed in this encounter.       Objective:   Physical Exam  Vitals:  Vitals:   04/29/16 1539  BP: 122/76  Pulse: 75  SpO2: 97%  Weight: 268 lb 12.8 oz (121.9 kg)  Height: 6\' 4"  (1.93 m)    Constitutional/General:  Pleasant, well-nourished, well-developed, not in any distress,  Comfortably seating.  Well kempt  Body mass index is 32.72 kg/m. Wt Readings from Last 3 Encounters:  04/29/16 268 lb 12.8 oz (121.9 kg)  04/07/16 261 lb 8 oz (118.6 kg)  01/22/16 265 lb 4 oz (120.3 kg)    HEENT: Pupils equal and reactive to light and accommodation. Anicteric sclerae. Normal nasal mucosa.   No oral  lesions,  mouth clear,  oropharynx clear, no postnasal drip. (-) Oral thrush. No dental caries.  Airway - Mallampati class III. Large tongue.   Neck: No masses. Midline trachea. No JVD, (-) LAD. (-) bruits appreciated.  Respiratory/Chest: Grossly normal chest. (-) deformity. (-) Accessory muscle use.  Symmetric expansion. (-) Tenderness on palpation.  Resonant on percussion.  Diminished BS on both lower lung zones. (-) wheezing, crackles, rhonchi (-) egophony  Cardiovascular: Regular rate and  rhythm,  heart sounds normal, no murmur or gallops, no peripheral edema  Gastrointestinal:  Normal bowel sounds. Soft, non-tender. No hepatosplenomegaly.  (-) masses.   Musculoskeletal:  Normal muscle tone. Normal gait.   Extremities: Grossly normal. (-) clubbing, cyanosis.  (-) edema  Skin: (-) rash,lesions seen.   Neurological/Psychiatric : alert, oriented to time, place, person. Normal mood and affect          Assessment & Plan:  Hypersomnia Patient has snoring, witnessed apneas, has gasping, choking.  Sleeps 7-8 hrs/night. Has mild hypersomnia in am. Has daily naps. (-) abnormal  behavior in sleep.  His BP has been elevated at 150s recently.  Diastolic in 282-060 mm Hg.   Hypersomnia affects his funtionality. ESS 9.   Plan ;  We discussed about the diagnosis of Obstructive Sleep Apnea (OSA) and implications of untreated OSA. We discussed about CPAP and BiPaP as possible treatment options.    We will schedule the patient for a sleep study. Plan for a Lab sleep study.  He had a sleep study 20 years ago which allegedly was positive but he was not started on CPAP because of insurance issues. Anticipate no issues with CPAP. Likely moderate-severe OSA.  Is on 5 BP meds so we need to treat.    Patient was instructed to call the office if he/she has not heard back from the office 1-2 weeks after the sleep study.   Patient was instructed to call the office if he/she is having issues with the PAP device.   We discussed good sleep hygiene.   Patient was advised not to engage in activities requiring concentration and/or vigilance if he/she is sleepy.  Patient was advised not to drive if he/she is sleepy.        Thank you very much for letting me participate in this patient's care. Please do not hesitate to give me a call if you have any questions or concerns regarding the treatment plan.   Patient will follow up with me in 8-10 weeks.     Monica Becton, MD 04/29/2016   5:06  PM Pulmonary and [place] Pager: [phone] Office: [phone], Fax: [phone]

## 2016-04-29 NOTE — Patient Instructions (Signed)
It was a pleasure taking care of you today!  We will schedule you to have a sleep study to determine if you have sleep apnea.     We will get a lab sleep study.  You will be scheduled to have a lab sleep study in 4-6 weeks.  Someone from the sleep lab will call you in 2-3 days to schedule the study with you.  They usually have cancellations every night so most likely, they will have openings for a lab sleep study next week or so.  We encourage you to do your sleep study then if possible. Please give us a call in a week is no one from the sleep lab calls you in 2-3 days.   If the sleep study is positive, we will order you a CPAP  machine.  Please call the office if you do NOT receive your machine in the next 1-2 weeks.   Please make sure you use your CPAP device everytime you sleep.  We will monitor the usage of your machine per your insurance requirement.  Your insurance company may take the machine from you if you are not using it regularly.   Please clean the mask, tubings, filter, water reservoir with soapy water every week.  Please use distilled water for the water reservoir.   Please call the office or your machine provider (DME company) if you are having issues with the device.   Return to clinic in 8-10 weeks with Dr. De Dios or NP    

## 2016-04-29 NOTE — Assessment & Plan Note (Addendum)
Patient has snoring, witnessed apneas, has gasping, choking.  Sleeps 7-8 hrs/night. Has mild hypersomnia in am. Has daily naps. (-) abnormal behavior in sleep.  His BP has been elevated at 150s recently.  Diastolic in 263-335 mm Hg.   Hypersomnia affects his funtionality. ESS 9.   Plan ;  We discussed about the diagnosis of Obstructive Sleep Apnea (OSA) and implications of untreated OSA. We discussed about CPAP and BiPaP as possible treatment options.    We will schedule the patient for a sleep study. Plan for a Lab sleep study.  He had a sleep study 20 years ago which allegedly was positive but he was not started on CPAP because of insurance issues. Anticipate no issues with CPAP. Likely moderate-severe OSA.  Is on 5 BP meds so we need to treat.    Patient was instructed to call the office if he/she has not heard back from the office 1-2 weeks after the sleep study.   Patient was instructed to call the office if he/she is having issues with the PAP device.   We discussed good sleep hygiene.   Patient was advised not to engage in activities requiring concentration and/or vigilance if he/she is sleepy.  Patient was advised not to drive if he/she is sleepy.

## 2016-05-13 DIAGNOSIS — E2749 Other adrenocortical insufficiency: Secondary | ICD-10-CM | POA: Diagnosis not present

## 2016-05-13 DIAGNOSIS — E038 Other specified hypothyroidism: Secondary | ICD-10-CM | POA: Diagnosis not present

## 2016-05-13 DIAGNOSIS — E291 Testicular hypofunction: Secondary | ICD-10-CM | POA: Diagnosis not present

## 2016-05-13 LAB — GLUCOSE (CC13)
ALT: 26
AST: 25 U/L
CREATININE: 0.77
Glucose: 127
Prostate Specific Ag, Serum: 0.9
TSH: 2.46
Vit D, 25-Hydroxy: 19.2

## 2016-05-19 ENCOUNTER — Other Ambulatory Visit: Payer: Self-pay | Admitting: *Deleted

## 2016-05-19 MED ORDER — GLIMEPIRIDE 2 MG PO TABS: 2.0000 mg | ORAL_TABLET | Freq: Every day | ORAL | 2 refills | Status: DC

## 2016-05-19 NOTE — Telephone Encounter (Signed)
Received faxed refill request from [facility] for Glimepiride. Refill sent to pharmacy electronically.

## 2016-05-26 DIAGNOSIS — R05 Cough: Secondary | ICD-10-CM | POA: Diagnosis not present

## 2016-05-26 DIAGNOSIS — H1045 Other chronic allergic conjunctivitis: Secondary | ICD-10-CM | POA: Diagnosis not present

## 2016-05-26 DIAGNOSIS — J300 Vasomotor rhinitis: Secondary | ICD-10-CM | POA: Diagnosis not present

## 2016-05-29 ENCOUNTER — Telehealth: Payer: Self-pay | Admitting: Family Medicine

## 2016-05-29 DIAGNOSIS — E119 Type 2 diabetes mellitus without complications: Secondary | ICD-10-CM

## 2016-05-29 NOTE — Telephone Encounter (Signed)
Left detailed message on voicemail and routed back to Dr. Damita Dunnings.

## 2016-05-29 NOTE — Telephone Encounter (Signed)
Was able to get the orders in.  Thanks.

## 2016-05-29 NOTE — Telephone Encounter (Signed)
Pt called to request lab orders for cpe be put in. He plans to go to [place] lab for draw, wants to go today around 2pm.

## 2016-05-29 NOTE — Telephone Encounter (Signed)
I need to review his chart before putting in orders.  It likely won't be done prior to 2pm.  Have him go next week.   Please route this back to me to put in the orders when possible.  Thanks.

## 2016-05-30 ENCOUNTER — Other Ambulatory Visit: Payer: Self-pay | Admitting: *Deleted

## 2016-05-30 MED ORDER — METFORMIN HCL 1000 MG PO TABS: 1000.0000 mg | ORAL_TABLET | Freq: Two times a day (BID) | ORAL | 3 refills | Status: DC

## 2016-06-02 ENCOUNTER — Ambulatory Visit (INDEPENDENT_AMBULATORY_CARE_PROVIDER_SITE_OTHER): Payer: Medicare Other | Admitting: Family Medicine

## 2016-06-02 ENCOUNTER — Encounter: Payer: Self-pay | Admitting: Family Medicine

## 2016-06-02 VITALS — BP 122/80 | HR 72 | Temp 97.7°F | Ht 76.0 in | Wt 267.5 lb

## 2016-06-02 DIAGNOSIS — I10 Essential (primary) hypertension: Secondary | ICD-10-CM

## 2016-06-02 DIAGNOSIS — Z7189 Other specified counseling: Secondary | ICD-10-CM

## 2016-06-02 DIAGNOSIS — E119 Type 2 diabetes mellitus without complications: Secondary | ICD-10-CM | POA: Diagnosis not present

## 2016-06-02 DIAGNOSIS — Z0000 Encounter for general adult medical examination without abnormal findings: Secondary | ICD-10-CM

## 2016-06-02 DIAGNOSIS — Z23 Encounter for immunization: Secondary | ICD-10-CM | POA: Diagnosis not present

## 2016-06-02 DIAGNOSIS — E785 Hyperlipidemia, unspecified: Secondary | ICD-10-CM

## 2016-06-02 LAB — LIPID PANEL
CHOL/HDL RATIO: 5
Cholesterol: 194 mg/dL (ref 0–200)
HDL: 37 mg/dL — AB (ref >39.00)
LDL CALC: 128 mg/dL — AB (ref 0–99)
NonHDL: 157.13
TRIGLYCERIDES: 144 mg/dL (ref 0.0–149.0)
VLDL: 28.8 mg/dL (ref 0.0–40.0)

## 2016-06-02 LAB — HEMOGLOBIN A1C: Hgb A1c MFr Bld: 7.4 % — ABNORMAL HIGH (ref 4.6–6.5)

## 2016-06-02 MED ORDER — LISINOPRIL 40 MG PO TABS: ORAL_TABLET | ORAL | 3 refills | Status: DC

## 2016-06-02 NOTE — Progress Notes (Signed)
Pre visit review using our clinic review tool, if applicable. No additional management support is needed unless otherwise documented below in the visit note. 

## 2016-06-02 NOTE — Progress Notes (Signed)
I have personally reviewed the Medicare Annual Wellness questionnaire and have noted 1. The patient's medical and social history 2. Their use of alcohol, tobacco or illicit drugs 3. Their current medications and supplements 4. The patient's functional ability including ADL's, fall risks, home safety risks and hearing or visual             impairment. 5. Diet and physical activities 6. Evidence for depression or mood disorders  The patients weight, height, BMI have been recorded in the chart and visual acuity is per eye clinic.  I have made referrals, counseling and provided education to the patient based review of the above and I have provided the pt with a written personalized care plan for preventive services.  Provider list updated- see scanned forms.  Routine anticipatory guidance given to patient.  See health maintenance. The possibility exists that previously documented standard health maintenance information may have been brought forward from a previous encounter into this note.  If needed, that same information has been updated to reflect the current situation based on today's encounter.    Flu up to date Shingles d/w pt.  See AVS PNA 2018 Tetanus 2012 Colonoscopy 2015 PSA wnl 2018 Advance directive-Living will d/w pt. He's working on this. Wife designated if patient were incapacitated.  Cognitive function addressed- see scanned forms- and if abnormal then additional documentation follows.   See EMR re: vision screening.    Diabetes:  Using medications without difficulties:yes Hypoglycemic episodes:no Hyperglycemic episodes: no Feet problems:no Blood Sugars averaging: usually had been <140, occ higher eye exam within last year: yes He is going on a weight loss plan, with accountability.   Due for labs.  D/w pt.    Hypertension:    Using medication without problems or lightheadedness: yes Chest pain with exertion:no Edema:no Short of breath:no  PMH and SH  reviewed  Meds, vitals, and allergies reviewed.   ROS: Per HPI.  Unless specifically indicated otherwise in HPI, the patient denies:  General: fever. Eyes: acute vision changes ENT: sore throat Cardiovascular: chest pain Respiratory: SOB GI: vomiting GU: dysuria Musculoskeletal: acute back pain Derm: acute rash Neuro: acute motor dysfunction Psych: worsening mood Endocrine: polydipsia Heme: bleeding Allergy: hayfever  GEN: nad, alert and oriented HEENT: mucous membranes moist NECK: supple w/o LA CV: rrr. PULM: ctab, no inc wob ABD: soft, +bs EXT: no edema SKIN: no acute rash  Diabetic foot exam: Normal inspection No skin breakdown No calluses  Normal DP pulses Normal sensation to light touch and monofilament Nails normal

## 2016-06-02 NOTE — Patient Instructions (Addendum)
I'll await your A1c and lipids.   Go to the lab on the way out.  We'll contact you with your lab report.  Check with your insurance to see if they will cover the shingles shot. Call Dr. Carlean Purl about follow up.  Plan on recheck in about 4 months with labs ahead of time.   Take care.  Glad to see you.

## 2016-06-06 NOTE — Assessment & Plan Note (Signed)
Flu up to date Shingles d/w pt.  See AVS PNA 2018 Tetanus 2012 Colonoscopy 2015 PSA wnl 2018 Advance directive-Living will d/w pt. He's working on this. Wife designated if patient were incapacitated.  Cognitive function addressed- see scanned forms- and if abnormal then additional documentation follows.

## 2016-06-06 NOTE — Assessment & Plan Note (Signed)
A1c pending. See notes on labs. Continue work on diet and exercise.

## 2016-06-06 NOTE — Assessment & Plan Note (Signed)
Lipids pending. See notes on labs. Continue work on diet and exercise.

## 2016-06-06 NOTE — Assessment & Plan Note (Signed)
Controlled. See after visit summary. Continue work on diet and exercise. He agrees.

## 2016-06-06 NOTE — Assessment & Plan Note (Signed)
Advance directive-Living will d/w pt. He's working on this. Wife designated if patient were incapacitated.

## 2016-06-16 ENCOUNTER — Ambulatory Visit (HOSPITAL_BASED_OUTPATIENT_CLINIC_OR_DEPARTMENT_OTHER): Payer: Medicare Other | Attending: Pulmonary Disease | Admitting: Pulmonary Disease

## 2016-06-16 DIAGNOSIS — G4710 Hypersomnia, unspecified: Secondary | ICD-10-CM | POA: Diagnosis not present

## 2016-06-16 DIAGNOSIS — G4733 Obstructive sleep apnea (adult) (pediatric): Secondary | ICD-10-CM | POA: Diagnosis not present

## 2016-06-18 ENCOUNTER — Telehealth: Payer: Self-pay | Admitting: Pulmonary Disease

## 2016-06-18 DIAGNOSIS — G4733 Obstructive sleep apnea (adult) (pediatric): Secondary | ICD-10-CM

## 2016-06-18 NOTE — Procedures (Signed)
NAME: Alexander [name] DATE OF BIRTH:  [birth year] MEDICAL RECORD NUMBER [id number]  LOCATION: [facility]  PHYSICIAN: Clover OF STUDY: 06/16/2016  SLEEP STUDY TYPE: Out of Center Sleep Test                CLINICAL INFORMATION  Sleep Study Type: NPSG  Indication for sleep study: Diabetes, Excessive Daytime Sleepiness, Fatigue, Hypertension, Obesity, OSA, Snoring, Witnessed Apneas  Epworth Sleepiness Score:   SLEEP STUDY TECHNIQUE  As per the AASM Manual for the Scoring of Sleep and Associated Events v2.3 (April 2016) with a hypopnea requiring 4% desaturations.  The channels recorded and monitored were frontal, central and occipital EEG, electrooculogram (EOG), submentalis EMG (chin), nasal and oral airflow, thoracic and abdominal wall motion, anterior tibialis EMG, snore microphone, electrocardiogram, and pulse oximetry.   MEDICATIONS  Medications self-administered by patient taken the night of the study : N/A. Meds reviewed per chart review done.  SLEEP ARCHITECTURE  The study was initiated at 10:02:42 PM and ended at 4:29:23 AM.  Sleep onset time was 9.1 minutes and the sleep efficiency was 92.8%. The total sleep time was 359.0 minutes.  Stage REM latency was 99.5 minutes.  The patient spent 12.95% of the night in stage N1 sleep, 67.69% in stage N2 sleep, 0.00% in stage N3 and 19.36% in REM.  Alpha intrusion was absent.  Supine sleep was 1.53%.   RESPIRATORY PARAMETERS  1. The overall apnea/hypopnea index (AHI) was 12.0 per hour. There were 29 total apneas, including 28 obstructive, 1 central and 0 mixed apneas. There were 43 hypopneas and 2 RERAs. 2. The AHI during Stage REM sleep was 31.1 per hour. 3. AHI while supine was 10.9 per hour. 4. The mean oxygen saturation was 91.05%. The minimum SpO2 during sleep was 79.00%. 5. Moderate snoring was noted during this study. 6.  CARDIAC DATA  The 2 lead EKG demonstrated sinus rhythm. The  mean heart rate was 56.84 beats per minute. Other EKG findings include: None.   LEG MOVEMENT DATA  The total PLMS were 0 with a resulting PLMS index of 0.00. Associated arousal with leg movement index was 0.0 .  IMPRESSIONS  1. Mild obstructive sleep apnea occurred during this study (AHI = 12.0/h). 2. No significant central sleep apnea occurred during this study (CAI = 0.2/h). 3. Moderate oxygen desaturation was noted during this study (Min O2 = 79.00%). 4. The patient snored with Moderate snoring volume. 5. No cardiac abnormalities were noted during this study. 6. Clinically significant periodic limb movements did not occur during sleep. No significant associated arousals  DIAGNOSIS  1. Obstructive Sleep Apnea (327.23 [G47.33 ICD-10]), worse in REM sleep  RECOMMENDATIONS  1. Therapeutic CPAP titration to determine optimal pressure required to alleviate sleep disordered breathing. Alternatively, may try patient on autocpap 5-15 cm water. He will need a mask fitting session to determine best mask fit. He will need a 1 month download to determine cpap efficacy. 2. Avoid alcohol, sedatives and other CNS depressants that may worsen sleep apnea and disrupt normal sleep architecture. 3. Sleep hygiene should be reviewed to assess factors that may improve sleep quality. 4. Weight management and regular exercise should be initiated or continued if appropriate. 5. Follow up in the office 4-6 weeks after obtaining cpap machine.   Monica Becton, MD 06/18/2016, 3:25 PM [facility] Pager [phone] After 3 pm or if no answer, call  [phone]    

## 2016-06-18 NOTE — Telephone Encounter (Signed)
   Please call the pt and tell the pt the [place]  showed OSA   Pt stops breathing 12   times an hour.   Please order autoCPAP 5-15 cm H2O. Patient will need a mask fitting session. Patient will need a 1 month download.   Patient needs to be seen by me or any of the NPs/APPs  4-6 weeks after obtaining the cpap machine. Let me know if you receive this.   Thanks!   J. Shirl Harris, MD 06/18/2016, 3:27 PM

## 2016-06-19 ENCOUNTER — Encounter: Payer: Self-pay | Admitting: Family Medicine

## 2016-06-19 NOTE — Telephone Encounter (Signed)
atc pt phone just rung, unable to leave a vm

## 2016-06-20 NOTE — Telephone Encounter (Signed)
Spoke with pt and he wants to wait a few days and then call us back to decide if he does want to be on cpap

## 2016-06-26 NOTE — Telephone Encounter (Signed)
ATC pt unidentified male stated pt was not home. Will try to call him later

## 2016-07-03 NOTE — Telephone Encounter (Signed)
Patient is returning [phone], states home number has a block on it.

## 2016-07-03 NOTE — Telephone Encounter (Signed)
Spoke with patient over 56mins about his CPAP machine. Answered various questions about the machine and sleep study. He agreed to trying the machine.   He wants to let Dr. Corrie Dandy to know that he has lost 16lbs since the study.   Will place the order today for the machine. Pt is scheduled with TP on 08/18/16 for a 6 week follow up. Will place reminder in mail for patient.   Nothing else is needed.

## 2016-07-08 ENCOUNTER — Telehealth: Payer: Self-pay | Admitting: Pulmonary Disease

## 2016-07-08 NOTE — Telephone Encounter (Signed)
atc [facility], reached after hours number.  Wcb.

## 2016-07-09 NOTE — Telephone Encounter (Signed)
Called and spoke with [facility] and they stated that they have received everything that they needed and they have called the pt and lmomtcb to see when he wants to be set up for the cpap. Nothing further is needed

## 2016-07-11 ENCOUNTER — Other Ambulatory Visit: Payer: Self-pay | Admitting: Family Medicine

## 2016-08-16 ENCOUNTER — Other Ambulatory Visit: Payer: Self-pay | Admitting: Family Medicine

## 2016-08-18 ENCOUNTER — Ambulatory Visit: Payer: Medicare Other | Admitting: Adult Health

## 2016-08-19 DIAGNOSIS — G4733 Obstructive sleep apnea (adult) (pediatric): Secondary | ICD-10-CM | POA: Diagnosis not present

## 2016-09-17 ENCOUNTER — Encounter: Payer: Self-pay | Admitting: Adult Health

## 2016-09-19 DIAGNOSIS — G4733 Obstructive sleep apnea (adult) (pediatric): Secondary | ICD-10-CM | POA: Diagnosis not present

## 2016-09-29 ENCOUNTER — Encounter: Payer: Self-pay | Admitting: Adult Health

## 2016-09-29 ENCOUNTER — Ambulatory Visit (INDEPENDENT_AMBULATORY_CARE_PROVIDER_SITE_OTHER): Payer: Medicare Other | Admitting: Adult Health

## 2016-09-29 DIAGNOSIS — E668 Other obesity: Secondary | ICD-10-CM | POA: Insufficient documentation

## 2016-09-29 DIAGNOSIS — G4733 Obstructive sleep apnea (adult) (pediatric): Secondary | ICD-10-CM | POA: Diagnosis not present

## 2016-09-29 HISTORY — DX: Obstructive sleep apnea (adult) (pediatric): G47.33

## 2016-09-29 NOTE — Assessment & Plan Note (Signed)
Wt loss  

## 2016-09-29 NOTE — Patient Instructions (Addendum)
Saline nasal spray and gel As needed   Continue on CPAP At bedtime  .  Wear for at least 4-6 hr each night .  Work on weight loss .  Follow up with Dr. Halford Chessman  Or [place] NP in 4-6 months and As needed

## 2016-09-29 NOTE — Progress Notes (Signed)
@Patient  ID: Alexander [name], male    DOB: 09-23-1950, 66 y.o.   MRN: [id number]  Chief Complaint  Patient presents with  . Follow-up    OSA     Referring provider: Tonia Ghent, MD  HPI: 66 year old male seen for sleep consult March 2018 for sleep disorder found to have mild sleep apnea  Test Home sleep study may 2018 AHI 12/hr   09/29/2016 Follow up : OSA  Patient returns for a six-month follow-up. Patient was seen earlier this year for a sleep consult for possible sleep apnea. A home sleep study revealed mild sleep apnea with AHI 12/hr. . Patient was started on C Pap at bedtime. Patient says he is doing well on C Pap. He feels rested with decreased daytime sleepiness. Patient uses his C Pap each night for about 7 hours. Download shows excellent compliance with 100% usage. On average he uses it 7 hours each night. Patient is on AutoSet 5-15 cm H2O. AHI 0.8. Minimum leaks.   Allergies  Allergen Reactions  . Clonidine Derivatives     cramps    Immunization History  Administered Date(s) Administered  . Influenza Split 11/18/2010  . Influenza Whole 11/19/2009  . Influenza, Seasonal, Injecte, Preservative Fre 11/25/2015  . Influenza-Unspecified 11/11/2013, 11/25/2014  . Pneumococcal Conjugate-13 06/02/2016  . Pneumococcal Polysaccharide-23 02/11/1999, 05/26/2013  . Td 02/10/1998, 04/02/2010    Past Medical History:  Diagnosis Date  . Allergy   . Chronic hepatitis C without mention of hepatic coma    treatment at [facility]  . 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 Diabetes mellitus    type II  . Full dentures   . Hypertension   . OSA (obstructive sleep apnea) 09/29/2016  . 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 Wears glasses     Tobacco History: History  Smoking Status  . Former Smoker  . Quit date: 02/10/1978  Smokeless Tobacco  . Never Used   Counseling given: Not Answered   Outpatient Encounter Prescriptions as of 09/29/2016  Medication Sig  .  amLODipine (NORVASC) 5 MG tablet Take 1-2 tablets (5-10 mg total) by mouth every evening.  Marland Kitchen aspirin 81 MG tablet Take 81 mg by mouth daily.  . diclofenac (VOLTAREN) 75 MG EC tablet Take 75 mg by mouth 2 (two) times daily.  . fexofenadine (ALLEGRA) 180 MG tablet Take 180 mg by mouth daily as needed.   Marland Kitchen glimepiride (AMARYL) 2 MG tablet Take 1 tablet (2 mg total) by mouth daily before breakfast.  . glucose blood (ONE TOUCH ULTRA TEST) test strip Use daily as needed for diabetes  . levocetirizine (XYZAL) 5 MG tablet Take 5 mg by mouth at bedtime as needed.   Marland Kitchen lisinopril (PRINIVIL,ZESTRIL) 40 MG tablet TAKE 1 TABLET (40 MG TOTAL) BY MOUTH DAILY.  . metFORMIN (GLUCOPHAGE) 1000 MG tablet Take 1 tablet (1,000 mg total) by mouth 2 (two) times daily.  . metoprolol succinate (TOPROL-XL) 100 MG 24 hr tablet TAKE 1 TABLET BY MOUTH  DAILY BUT IF BP IS GREATER  THAN 140/90 THEN TAKE 2  TABLETS BY MOUTH DAILY  . montelukast (SINGULAIR) 10 MG tablet Take 10 mg by mouth at bedtime as needed.   . Multiple Vitamin (MULTIVITAMIN) tablet Take 1 tablet by mouth daily.    . ONE TOUCH ULTRA TEST test strip USE DAILY AS NEEDED FOR DIABETES  . ONETOUCH DELICA LANCETS 71I MISC Use daily to check sugar as needed.  Dx E11.9  . valsartan-hydrochlorothiazide (  DIOVAN-HCT) 320-25 MG tablet TAKE 1 TABLET BY MOUTH  DAILY   No facility-administered encounter medications on file as of 09/29/2016.      Review of Systems  Constitutional:   No  weight loss, night sweats,  Fevers, chills, fatigue, or  lassitude.  HEENT:   No headaches,  Difficulty swallowing,  Tooth/dental problems, or  Sore throat,                No sneezing, itching, ear ache, nasal congestion, post nasal drip,   CV:  No chest pain,  Orthopnea, PND, swelling in lower extremities, anasarca, dizziness, palpitations, syncope.   GI  No heartburn, indigestion, abdominal pain, nausea, vomiting, diarrhea, change in bowel habits, loss of appetite, bloody stools.    Resp: No shortness of breath with exertion or at rest.  No excess mucus, no productive cough,  No non-productive cough,  No coughing up of blood.  No change in color of mucus.  No wheezing.  No chest wall deformity  Skin: no rash or lesions.  GU: no dysuria, change in color of urine, no urgency or frequency.  No flank pain, no hematuria   MS:  +knee pain     Physical Exam  BP 130/76 (BP Location: Right Arm, Patient Position: Sitting, Cuff Size: Normal)   Pulse (!) 59   Ht 6' 3.75" (1.924 m)   Wt 259 lb (117.5 kg)   SpO2 99%   BMI 31.73 kg/m   GEN: A/Ox3; pleasant , NAD, obese ,    HEENT:  [place]/AT,  EACs-clear, TMs-wnl, NOSE-clear, THROAT-clear, no lesions, no postnasal drip or exudate noted. Class 2-3 MP airway   NECK:  Supple w/ fair ROM; no JVD; normal carotid impulses w/o bruits; no thyromegaly or nodules palpated; no lymphadenopathy.    RESP  Clear  P & A; w/o, wheezes/ rales/ or rhonchi. no accessory muscle use, no dullness to percussion  CARD:  RRR, no m/r/g, no peripheral edema, pulses intact, no cyanosis or clubbing.  GI:   Soft & nt; nml bowel sounds; no organomegaly or masses detected.   Musco: Warm bil, no deformities or joint swelling noted. Left knee brace .   Neuro: alert, no focal deficits noted.    Skin: Warm, no lesions or rashes    Lab Results:  CBC   BNP No results found for: BNP  ProBNP No results found for: PROBNP  Imaging: No results found.   Assessment & Plan:   OSA (obstructive sleep apnea) Doing well on CPAP   Plan  Patient Instructions  Saline nasal spray and gel As needed   Continue on CPAP At bedtime  .  Wear for at least 4-6 hr each night .  Work on weight loss .  Follow up with Dr. Halford Chessman  Or [place] NP in 4-6 months and As needed       Moderate obesity Wt loss     Rexene Edison, NP 09/29/2016

## 2016-09-29 NOTE — Assessment & Plan Note (Signed)
Doing well on CPAP   Plan  Patient Instructions  Saline nasal spray and gel As needed   Continue on CPAP At bedtime  .  Wear for at least 4-6 hr each night .  Work on weight loss .  Follow up with Dr. Halford Chessman  Or Draco Malczewski NP in 4-6 months and As needed

## 2016-09-30 NOTE — Progress Notes (Signed)
I have reviewed and agree with assessment/plan.  Chesley Mires, MD [facility]monary[facility] 09/30/2016, 7:44 AM Pager:  [phone]

## 2016-10-01 ENCOUNTER — Telehealth: Payer: Self-pay | Admitting: Family Medicine

## 2016-10-01 DIAGNOSIS — E119 Type 2 diabetes mellitus without complications: Secondary | ICD-10-CM

## 2016-10-01 NOTE — Telephone Encounter (Signed)
Pt called he wants to go to [facility] for labs on Monday  Need orders in system  Best number [phone]

## 2016-10-03 NOTE — Telephone Encounter (Signed)
Ordered. Thanks

## 2016-10-06 ENCOUNTER — Other Ambulatory Visit: Payer: Medicare Other

## 2016-10-06 ENCOUNTER — Other Ambulatory Visit (INDEPENDENT_AMBULATORY_CARE_PROVIDER_SITE_OTHER): Payer: Medicare Other

## 2016-10-06 DIAGNOSIS — E119 Type 2 diabetes mellitus without complications: Secondary | ICD-10-CM | POA: Diagnosis not present

## 2016-10-06 LAB — COMPREHENSIVE METABOLIC PANEL
ALT: 21 U/L (ref 0–53)
AST: 19 U/L (ref 0–37)
Albumin: 4.4 g/dL (ref 3.5–5.2)
Alkaline Phosphatase: 40 U/L (ref 39–117)
BUN: 14 mg/dL (ref 6–23)
CO2: 33 mEq/L — ABNORMAL HIGH (ref 19–32)
Calcium: 9.2 mg/dL (ref 8.4–10.5)
Chloride: 100 mEq/L (ref 96–112)
Creatinine, Ser: 0.86 mg/dL (ref 0.40–1.50)
GFR: 114.54 mL/min (ref >60.00)
Glucose, Bld: 132 mg/dL — ABNORMAL HIGH (ref 70–99)
Potassium: 3.4 mEq/L — ABNORMAL LOW (ref 3.5–5.1)
Sodium: 140 mEq/L (ref 135–145)
Total Bilirubin: 0.5 mg/dL (ref 0.2–1.2)
Total Protein: 7.5 g/dL (ref 6.0–8.3)

## 2016-10-06 LAB — LIPID PANEL
Cholesterol: 151 mg/dL (ref 0–200)
HDL: 34.5 mg/dL — ABNORMAL LOW (ref >39.00)
LDL Cholesterol: 101 mg/dL — ABNORMAL HIGH (ref 0–99)
NonHDL: 116.45
Total CHOL/HDL Ratio: 4
Triglycerides: 78 mg/dL (ref 0.0–149.0)
VLDL: 15.6 mg/dL (ref 0.0–40.0)

## 2016-10-06 LAB — HEMOGLOBIN A1C: Hgb A1c MFr Bld: 6.7 % — ABNORMAL HIGH (ref 4.6–6.5)

## 2016-10-10 ENCOUNTER — Encounter: Payer: Self-pay | Admitting: Family Medicine

## 2016-10-10 ENCOUNTER — Ambulatory Visit (INDEPENDENT_AMBULATORY_CARE_PROVIDER_SITE_OTHER): Payer: Medicare Other | Admitting: Family Medicine

## 2016-10-10 VITALS — BP 156/90 | HR 55 | Temp 97.8°F | Wt 255.2 lb

## 2016-10-10 DIAGNOSIS — E785 Hyperlipidemia, unspecified: Secondary | ICD-10-CM

## 2016-10-10 DIAGNOSIS — E119 Type 2 diabetes mellitus without complications: Secondary | ICD-10-CM | POA: Diagnosis not present

## 2016-10-10 DIAGNOSIS — I10 Essential (primary) hypertension: Secondary | ICD-10-CM | POA: Diagnosis not present

## 2016-10-10 DIAGNOSIS — M109 Gout, unspecified: Secondary | ICD-10-CM | POA: Diagnosis not present

## 2016-10-10 NOTE — Progress Notes (Signed)
Diabetes:  Using medications without difficulties:yes, see below.  Hypoglycemic episodes:no Hyperglycemic episodes:no Feet problems:no Blood Sugars averaging: ~120-130s often eye exam within last year: due, pending.   He is not needing the glyburide every day, skipping it when sugar is ~120 in the AM.   He is working on weight loss. D/w pt.    Flu shot to be done at work. D/w pt.    He has f/u with GI and cardiology pending.   He forgot his BP meds this AM.  D/w pt.  He can recheck his BP at home and update me as needed.  He had some ankle edema with a gout flare and skipped his amlodipine to help with ankle edema.  The pain is getting better with cherry juice and avoiding trigger foods.  Has been using ice.  He is better now than a few days ago.    Valsartan recall d/w pt.  He may have already been sent a refill that was not in the recall group. See AVS.    PMH and SH reviewed  Meds, vitals, and allergies reviewed.   ROS: Per HPI unless specifically indicated in ROS section   GEN: nad, alert and oriented HEENT: mucous membranes moist NECK: supple w/o LA CV: rrr. PULM: ctab, no inc wob ABD: soft, +bs EXT: R ankle with 1+ edema, no edema on the left ankle SKIN: no acute rash  Diabetic foot exam: Normal inspection No skin breakdown No calluses  Normal DP pulses Normal sensation to light touch and monofilament Nails normal

## 2016-10-10 NOTE — Patient Instructions (Addendum)
Check to make sure your valsartan is not in the recall list.  If it is, we need to change you to another medicine.   Recheck in about 3 months.   Ask the GI clinic if you would be able to start a statin.  Thanks for your effort.  Take care.  Glad to see you.

## 2016-10-13 NOTE — Assessment & Plan Note (Addendum)
He is not needing the glyburide every day, skipping it when sugar is ~120 in the AM.   This is reasonable. Recheck in a few months. Continue work on diet and exercise. Update me as needed. >25 minutes spent in face to face time with patient, >50% spent in counselling or coordination of care.

## 2016-10-13 NOTE — Assessment & Plan Note (Signed)
Valsartan recall d/w pt.  He may have already been sent a refill that was not in the recall group. See AVS.

## 2016-10-13 NOTE — Assessment & Plan Note (Signed)
Recent flare is improving. He was using cherry juice and that helps a lot. He is avoiding trigger foods. Continue work on weight loss.

## 2016-10-13 NOTE — Assessment & Plan Note (Signed)
I asked him to talk with the GI clinic about potentially starting a statin.  Would be indicated given diabetes and his history of high blood pressure. History of hepatitis, treated. I will defer to GI and I appreciate their input.

## 2016-10-20 DIAGNOSIS — G4733 Obstructive sleep apnea (adult) (pediatric): Secondary | ICD-10-CM | POA: Diagnosis not present

## 2016-10-27 ENCOUNTER — Encounter: Payer: Self-pay | Admitting: Internal Medicine

## 2016-10-27 ENCOUNTER — Other Ambulatory Visit (INDEPENDENT_AMBULATORY_CARE_PROVIDER_SITE_OTHER): Payer: Medicare Other

## 2016-10-27 ENCOUNTER — Ambulatory Visit (INDEPENDENT_AMBULATORY_CARE_PROVIDER_SITE_OTHER): Payer: Medicare Other | Admitting: Internal Medicine

## 2016-10-27 VITALS — BP 134/82 | HR 60 | Ht 74.0 in | Wt 252.5 lb

## 2016-10-27 DIAGNOSIS — B182 Chronic viral hepatitis C: Secondary | ICD-10-CM

## 2016-10-27 DIAGNOSIS — K7460 Unspecified cirrhosis of liver: Secondary | ICD-10-CM

## 2016-10-27 LAB — CBC WITH DIFFERENTIAL/PLATELET
BASOS PCT: 1.2 % (ref 0.0–3.0)
Basophils Absolute: 0.1 10*3/uL (ref 0.0–0.1)
EOS PCT: 2.1 % (ref 0.0–5.0)
Eosinophils Absolute: 0.1 10*3/uL (ref 0.0–0.7)
HEMATOCRIT: 41.7 % (ref 39.0–52.0)
Hemoglobin: 13.6 g/dL (ref 13.0–17.0)
LYMPHS ABS: 2.1 10*3/uL (ref 0.7–4.0)
LYMPHS PCT: 40.7 % (ref 12.0–46.0)
MCHC: 32.6 g/dL (ref 30.0–36.0)
MCV: 79.3 fl (ref 78.0–100.0)
Monocytes Absolute: 0.5 10*3/uL (ref 0.1–1.0)
Monocytes Relative: 9.9 % (ref 3.0–12.0)
NEUTROS ABS: 2.4 10*3/uL (ref 1.4–7.7)
NEUTROS PCT: 46.1 % (ref 43.0–77.0)
Platelets: 181 10*3/uL (ref 150.0–400.0)
RBC: 5.26 Mil/uL (ref 4.22–5.81)
RDW: 14.3 % (ref 11.5–15.5)
WBC: 5.2 10*3/uL (ref 4.0–10.5)

## 2016-10-27 LAB — PROTIME-INR
INR: 1.4 ratio — ABNORMAL HIGH (ref 0.8–1.0)
Prothrombin Time: 14.6 s — ABNORMAL HIGH (ref 9.6–13.1)

## 2016-10-27 NOTE — Patient Instructions (Addendum)
Your physician has requested that you go to the basement for lab work before leaving today.  You have been scheduled for an abdominal ultrasound at [facility] ([facility]) on 11/04/16 at 2:42PN. Make certain not to have anything to eat or drink after midnight before your appointment. Should you need to reschedule your appointment, please contact radiology at [phone]. This test typically takes about 30 minutes to perform.

## 2016-10-27 NOTE — Progress Notes (Signed)
Alexander [name] 66 y.o. [birth year] [id number]  Assessment & Plan:   Encounter Diagnoses  Name Primary?  . Chronic hepatitis C virus infection with cirrhosis ([place]) Yes   Clinically he is doing well. I think it makes sense to repeat an ultrasound, check alpha-fetoprotein, CBC to follow-up on platelets, and an INR. We do know that in some patients liver histology improves after eradication of hepatitis C. Hopefully that's the case for him are at least he is stable. Once I see these results we can make a decision about whether or not he needs another EGD to screen for varices.  His vaccinations are all up-to-date. I did recommend he asked Dr. Damita Dunnings for his opinion about the new shingles vaccine though not related was liver problem might be worth getting.  The patient has done a great job with weight loss.  I appreciate the opportunity to care for this patient. CC: Alexander Ghent, MD    Subjective:   Chief Complaint: Follow-up of cirrhosis  HPI Alexander [name] is here for follow-up, last seen in 2016, for history of treated hepatitis C and mild cirrhosis child's a. He had had MRI studies at [facility] and there was a small 6 mm lesion that needed follow-up and has been stable and is thought to be a benign lymphovascular lesion. I had recommended a repeat ultrasound in 2017 he has not had that done yet. He has been changing his eating habits, restricting carbohydrates and reducing portions and says his lost 25 pounds since he retired. That was about 9 years ago but he has had steady reduction in his weight I do note that in 2015 was 264 pounds and he is 252 pounds today. Allergies  Allergen Reactions  . Clonidine Derivatives     cramps   Current Meds  Medication Sig  . amLODipine (NORVASC) 5 MG tablet Take 1-2 tablets (5-10 mg total) by mouth every evening.  Marland Kitchen aspirin 81 MG tablet Take 81 mg by mouth daily.  . diclofenac (VOLTAREN) 75 MG EC tablet Take 75 mg by mouth 2 (two) times daily.    . fexofenadine (ALLEGRA) 180 MG tablet Take 180 mg by mouth daily as needed.   Marland Kitchen glimepiride (AMARYL) 2 MG tablet Take 1 tablet (2 mg total) by mouth daily before breakfast.  . glucose blood (ONE TOUCH ULTRA TEST) test strip Use daily as needed for diabetes  . levocetirizine (XYZAL) 5 MG tablet Take 5 mg by mouth at bedtime as needed.   Marland Kitchen lisinopril (PRINIVIL,ZESTRIL) 40 MG tablet TAKE 1 TABLET (40 MG TOTAL) BY MOUTH DAILY.  . metFORMIN (GLUCOPHAGE) 1000 MG tablet Take 1 tablet (1,000 mg total) by mouth 2 (two) times daily.  . metoprolol succinate (TOPROL-XL) 100 MG 24 hr tablet TAKE 1 TABLET BY MOUTH  DAILY BUT IF BP IS GREATER  THAN 140/90 THEN TAKE 2  TABLETS BY MOUTH DAILY  . montelukast (SINGULAIR) 10 MG tablet Take 10 mg by mouth at bedtime as needed.   . Multiple Vitamin (MULTIVITAMIN) tablet Take 1 tablet by mouth daily.    . ONE TOUCH ULTRA TEST test strip USE DAILY AS NEEDED FOR DIABETES  . ONETOUCH DELICA LANCETS 08X MISC Use daily to check sugar as needed.  Dx E11.9  . valsartan-hydrochlorothiazide (DIOVAN-HCT) 320-25 MG tablet TAKE 1 TABLET BY MOUTH  DAILY   Past Medical History:  Diagnosis Date  . Allergy   . Chronic hepatitis C without mention of hepatic coma    treatment at [place]  .  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 Diabetes mellitus    type II  . Full dentures   . Hypertension   . OSA (obstructive sleep apnea) 09/29/2016  . 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 Wears glasses    Past Surgical History:  Procedure Laterality Date  . COLONOSCOPY    . ESOPHAGOGASTRODUODENOSCOPY     multiple  . THYROID LOBECTOMY  2005   right   Social History   Social History  . Marital status: Married    Spouse name: [name]   Occupational History  . 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 Smoking status: Former Smoker    Quit date: 02/10/1978  . Smokeless tobacco: Never Used  . Alcohol use No  . Drug use: No    Social History Narrative   Married Scientist, product/process development, at [employer].     He is retired from [employer]   family history includes Cancer in his father; Diabetes in his sister; Hypertension in his brother, father, mother, and sister; Stroke in his mother.   Review of Systems As per history of present illness  Objective:   Physical Exam @BP  134/82 (BP Location: Left Arm, Patient Position: Sitting, Cuff Size: Normal)   Pulse 60   Ht 6\' 2"  (1.88 m) Comment: height measured without shoes  Wt 252 lb 8 oz (114.5 kg)   BMI 32.42 kg/m @  General:  NAD Eyes:   anicteric Lungs:  clear Heart::  S1S2 no rubs, murmurs or gallops Abdomen:  soft and nontender, BS+No hepatosplenomegaly or mass no stigmata of chronic liver disease seen Ext:   no , cyanosis or clubbing    Data Reviewed:   Previous MRI scans, [facility] hepatitis C treatment.   25 minutes time spent with patient > half in counseling coordination of care

## 2016-10-28 LAB — AFP TUMOR MARKER: AFP-Tumor Marker: 10.9 ng/mL — ABNORMAL HIGH (ref <6.1)

## 2016-10-31 NOTE — Progress Notes (Signed)
Labs ok PLTs now NL My Chart

## 2016-11-04 ENCOUNTER — Ambulatory Visit
Admission: RE | Admit: 2016-11-04 | Discharge: 2016-11-04 | Disposition: A | Discharge status: Home / Self Care | Payer: Medicare Other | Source: Ambulatory Visit | Attending: Internal Medicine | Admitting: Internal Medicine

## 2016-11-04 DIAGNOSIS — B182 Chronic viral hepatitis C: Secondary | ICD-10-CM

## 2016-11-04 DIAGNOSIS — K7460 Unspecified cirrhosis of liver: Principal | ICD-10-CM

## 2016-11-19 DIAGNOSIS — G4733 Obstructive sleep apnea (adult) (pediatric): Secondary | ICD-10-CM | POA: Diagnosis not present

## 2016-12-20 DIAGNOSIS — G4733 Obstructive sleep apnea (adult) (pediatric): Secondary | ICD-10-CM | POA: Diagnosis not present

## 2016-12-31 ENCOUNTER — Encounter: Payer: Self-pay | Admitting: Cardiovascular Disease

## 2017-01-08 NOTE — Progress Notes (Signed)
Patient ID: Alexander [name], male   DOB: Jun 08, 1950, 66 y.o.   MRN: [id number]    66 y.o.  referred by anesthesia Alexander [name] for abnormal ECG 12/2013  . Needed right knee arthroscopy with partial medial meniscectomy.  Reviewed ECG from 12/22/13  SR rate 48  LAD IMI inferolateral T wave changes   Quit smoking in 80's  Has HTN  Not on statin BS poorly controlled and A1c chronically runs high last recorded 6.7 10/06/16  LDL is 138  He sees hepatology at [facility] for cirrhosis   Has hepatitis C previous endocscopy negative for varices PLTls chronically low 119-139   He injured knee doing patio work.  He denies dyspnea chest pain or palpitations Denies history of MI.  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 Bicuspid no stenosis trivial AR normal EF 55-60% Most recent echo 09/17/15 reviewed  Study Conclusions  - Left ventricle: The cavity size was normal. There was mild   concentric hypertrophy. Systolic function was vigorous. The   estimated ejection fraction was in the range of 65% to 70%. Wall   motion was normal; there were no regional wall motion   abnormalities. Doppler parameters are consistent with abnormal   left ventricular relaxation (grade 1 diastolic dysfunction).   Doppler parameters are consistent with indeterminate ventricular   filling pressure. - Aortic valve: Trileaflet; moderately thickened, moderately   calcified leaflets. Sclerosis without stenosis. There was mild   regurgitation. Regurgitation pressure half-time: 604 ms. - Mitral valve: Transvalvular velocity was within the normal range.   There was no evidence for stenosis. There was trivial   regurgitation. - Right ventricle: The cavity size was normal. Wall thickness was   normal. Systolic function was normal. - Tricuspid valve: There was trivial  regurgitation. - Pulmonary arteries: Systolic pressure was within the normal   range. PA peak pressure: 27 mm Hg (S).   Alexander [name].  Wife has heart issues and sees Alexander [name] Not working out at [employer] as much due to left knee pain Has seen Alexander [name] before and has had cortisone injections    ROS: Denies fever, malais, weight loss, blurry vision, decreased visual acuity, cough, sputum, SOB, hemoptysis, pleuritic pain, palpitaitons, heartburn, abdominal pain, melena, lower extremity edema, claudication, or rash.  All other systems reviewed and negative   General: BP (!) 156/88   Pulse 65   Ht 6\' 3"  (1.905 m)   Wt 255 lb 8 oz (115.9 kg)   SpO2 99%   BMI 31.94 kg/m  Affect appropriate Healthy:  appears stated age 62: normal Neck supple with no adenopathy JVP normal no bruits no thyromegaly Lungs clear with no wheezing and good diaphragmatic motion Heart:  S1/S2 SEM murmur, no rub, gallop or click PMI normal Abdomen: benighn, BS positve, no tenderness, no AAA no bruit.  No HSM or HJR Distal pulses intact with no bruits No edema Neuro non-focal Skin warm and dry No muscular weakness  Skin warm and dry No muscular weakness  Medications Current Outpatient Medications  Medication Sig Dispense Refill  . amLODipine (NORVASC) 5 MG tablet Take 1-2 tablets (5-10 mg total) by mouth every evening. 180 tablet 3  . aspirin 81 MG tablet Take 81 mg by mouth daily.    . diclofenac (VOLTAREN) 75 MG EC tablet Take 75 mg  by mouth 2 (two) times daily.    . fexofenadine (ALLEGRA) 180 MG tablet Take 180 mg by mouth daily as needed.     Marland Kitchen glimepiride (AMARYL) 2 MG tablet Take 1 tablet (2 mg total) by mouth daily before breakfast. 90 tablet 2  . glucose blood (ONE TOUCH ULTRA TEST) test strip Use daily as needed for diabetes 100 each 1  . levocetirizine (XYZAL) 5 MG tablet Take 5 mg by mouth at bedtime as needed.   0  . lisinopril (PRINIVIL,ZESTRIL) 40 MG tablet TAKE 1 TABLET (40 MG  TOTAL) BY MOUTH DAILY. 90 tablet 3  . metFORMIN (GLUCOPHAGE) 1000 MG tablet Take 1 tablet (1,000 mg total) by mouth 2 (two) times daily. 180 tablet 3  . metoprolol succinate (TOPROL-XL) 100 MG 24 hr tablet TAKE 1 TABLET BY MOUTH  DAILY BUT IF BP IS GREATER  THAN 140/90 THEN TAKE 2  TABLETS BY MOUTH DAILY 180 tablet 3  . montelukast (SINGULAIR) 10 MG tablet Take 10 mg by mouth at bedtime as needed.     . Multiple Vitamin (MULTIVITAMIN) tablet Take 1 tablet by mouth daily.      . ONE TOUCH ULTRA TEST test strip USE DAILY AS NEEDED FOR DIABETES 100 each 3  . ONETOUCH DELICA LANCETS 44W MISC Use daily to check sugar as needed.  Dx E11.9 100 each 3  . valsartan-hydrochlorothiazide (DIOVAN-HCT) 320-25 MG tablet TAKE 1 TABLET BY MOUTH  DAILY 90 tablet 1   No current facility-administered medications for this visit.     Allergies Clonidine derivatives  Family History: Family History  Problem Relation Age of Onset  . Hypertension Mother   . Stroke Mother   . Cancer Father        stomach CA  . Hypertension Father   . Hypertension Sister   . Diabetes Sister   . Hypertension Brother   . Prostate cancer Neg Hx   . Colon cancer Neg Hx     Social History: Social History   Socioeconomic History  . Marital status: Married    Spouse name: Alexander [name]  . Number of children: Not on file  . Years of education: Not on file  . Highest education level: Not on file  Social Needs  . Financial resource strain: Not on file  . Food insecurity - worry: Not on file  . Food insecurity - inability: Not on file  . Transportation needs - medical: Not on file  . Transportation needs - non-medical: Not on file  Occupational History  . Occupation: Retired    Fish farm manager: [employer]    Comment: Also a Clinical biochemist at [employer]  Tobacco Use  . Smoking status: Former Smoker    Last attempt to quit: 02/10/1978    Years since quitting: 38.9  . Smokeless tobacco: Never Used  Substance and Sexual Activity  . Alcohol  use: No    Alcohol/week: 0.0 oz  . Drug use: No  . Sexual activity: Not on file  Other Topics Concern  . Not on file  Social History Narrative   Married 1978   Chaplain, at [employer].     He is retired from [employer] [employer]    Past Surgical History:  Procedure Laterality Date  . COLONOSCOPY    . ESOPHAGOGASTRODUODENOSCOPY     multiple  . THYROID LOBECTOMY  2005   right    Past Medical History:  Diagnosis Date  . Allergy   . Chronic hepatitis C without mention of hepatic coma  treatment at [place]  . 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 Diabetes mellitus    type II  . Full dentures   . Hypertension   . OSA (obstructive sleep apnea) 09/29/2016  . 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 Wears glasses     Electrocardiogram:  11/02/13  SR rate 85  IMI early R wave transition ? Posterior MI  ECG 11/12 shows more inferolateral T wave changes compared 04/09/15  SR rate 66 LVH ? Old IMI  01/12/17 SR rate 58 inferior lateral T wave changes   Assessment and Plan  Abnormal ECG chronic no evidence of MI or structural abnormality on echo/ myovue AV Disease no change in murmur AV sclerosis /Mild AR f/u echo in a year  HTN: Well controlled.  Continue current medications and low sodium Dash type diet.   DM: Discussed low carb diet.  Target hemoglobin A1c is 6.5 or less.  Continue current medications. Ortho:  Encouraged him to see f/u consider mobic for pain needs xrays and evaluation for possible TKR   Jenkins Rouge

## 2017-01-12 ENCOUNTER — Encounter: Payer: Self-pay | Admitting: Cardiovascular Disease

## 2017-01-12 ENCOUNTER — Ambulatory Visit: Payer: Medicare Other | Admitting: Cardiovascular Disease

## 2017-01-12 VITALS — BP 156/88 | HR 65 | Ht 75.0 in | Wt 255.5 lb

## 2017-01-12 DIAGNOSIS — I359 Nonrheumatic aortic valve disorder, unspecified: Secondary | ICD-10-CM

## 2017-01-12 DIAGNOSIS — I10 Essential (primary) hypertension: Secondary | ICD-10-CM

## 2017-01-12 NOTE — Patient Instructions (Addendum)
Medication Instructions:  Your physician recommends that you continue on your current medications as directed. Please refer to the Current Medication list given to you today.  Labwork: NONE  Testing/Procedures: Your physician has requested that you have an echocardiogram in 1 year. Echocardiography is a painless test that uses sound waves to create images of your heart. It provides your doctor with information about the size and shape of your heart and how well your heart's chambers and valves are working. This procedure takes approximately one hour. There are no restrictions for this procedure.  Follow-Up: Your physician wants you to follow-up in: 12 months with Dr. Nishan. You will receive a reminder letter in the mail two months in advance. If you don't receive a letter, please call our office to schedule the follow-up appointment.   If you need a refill on your cardiac medications before your next appointment, please call your pharmacy.    

## 2017-01-13 DIAGNOSIS — M79675 Pain in left toe(s): Secondary | ICD-10-CM | POA: Diagnosis not present

## 2017-01-13 DIAGNOSIS — L03032 Cellulitis of left toe: Secondary | ICD-10-CM | POA: Diagnosis not present

## 2017-01-13 DIAGNOSIS — E139 Other specified diabetes mellitus without complications: Secondary | ICD-10-CM | POA: Diagnosis not present

## 2017-01-13 DIAGNOSIS — L02612 Cutaneous abscess of left foot: Secondary | ICD-10-CM | POA: Diagnosis not present

## 2017-01-14 ENCOUNTER — Telehealth: Payer: Self-pay | Admitting: Family Medicine

## 2017-01-14 NOTE — Telephone Encounter (Signed)
Copied from [place] [phone] [id number]. Topic: Quick Communication - See Telephone Encounter >> Jan 14, 2017  3:08 PM Bea Graff, NT wrote: CRM for notification. See Telephone encounter for: Patient states that he called the office on [address] and needs Dr. Damita Dunnings to put orders in so he can have his labs drawn at that office. Please advice.  I do see active future orders in but he states he needs new ones so he can have it done at [facility]. 01/14/17.

## 2017-01-14 NOTE — Telephone Encounter (Signed)
I left v/m per DPR that there are active future orders for A1C and uric acid in pts chart and can have labs drawn at [facility].

## 2017-01-19 ENCOUNTER — Ambulatory Visit: Payer: Medicare Other

## 2017-01-19 ENCOUNTER — Other Ambulatory Visit: Payer: Medicare Other

## 2017-01-19 DIAGNOSIS — G4733 Obstructive sleep apnea (adult) (pediatric): Secondary | ICD-10-CM | POA: Diagnosis not present

## 2017-01-20 ENCOUNTER — Ambulatory Visit: Payer: Medicare Other | Admitting: Family Medicine

## 2017-01-23 ENCOUNTER — Other Ambulatory Visit: Payer: Self-pay | Admitting: Family Medicine

## 2017-01-26 ENCOUNTER — Ambulatory Visit: Payer: Medicare Other | Admitting: Pulmonary Disease

## 2017-01-26 ENCOUNTER — Encounter: Payer: Self-pay | Admitting: Adult Health

## 2017-01-26 ENCOUNTER — Other Ambulatory Visit: Payer: Self-pay | Admitting: Family Medicine

## 2017-01-26 VITALS — BP 128/78 | HR 58 | Ht 72.0 in | Wt 260.0 lb

## 2017-01-26 DIAGNOSIS — J310 Chronic rhinitis: Secondary | ICD-10-CM | POA: Diagnosis not present

## 2017-01-26 DIAGNOSIS — G4733 Obstructive sleep apnea (adult) (pediatric): Secondary | ICD-10-CM

## 2017-01-26 DIAGNOSIS — Z9989 Dependence on other enabling machines and devices: Secondary | ICD-10-CM

## 2017-01-26 NOTE — Patient Instructions (Signed)
Follow up in 1 year.

## 2017-01-26 NOTE — Progress Notes (Addendum)
[facility]  Chief Complaint  Patient presents with  . Sleep Apnea    Follow up    Vital signs: BP 128/78 (BP Location: Left Arm, Cuff Size: Normal)   Pulse (!) 58   Ht 6' (1.829 m)   Wt 260 lb (117.9 kg)   SpO2 97%   BMI 35.26 kg/m   History of Present Illness: Alexander [name] is a 66 y.o. male obstructive sleep apnea.  He had sleep study in May 2018.  Showed mild sleep apnea.  Started on auto CPAP.  Doing well.  Has full face mask.  Occasional marks on face from straps.  Occasional nasal congestion.  Uses saline spray.  Has SoClean.   Physical Exam:  General - pleasant Eyes - pupils reactive, wears glasses ENT - no sinus tenderness, no oral exudate, no LAN, MP 3 Cardiac - regular, no murmur Chest - no wheeze, rales Abd - soft, non tender Ext - no edema Skin - no rashes Neuro - normal strength Psych - normal mood  Assessment/Plan:  Obstructive sleep apnea. - he is compliant with CPAP and reports benefit from therapy - continue auto CPAP - discussed options to assist with mask fit  CPAP rhinitis. - he can continue prn nasal irrigation - advised he could use OTC nasal steroid such as flonase or nasacort prn - demonstrated proper use of nasal sprays   Patient Instructions  Follow up in 1 year   Chesley Mires, MD [place] 01/26/2017, 10:00 AM Pager:  [phone]  Flow Sheet  Pulmonary tests:  Sleep tests: PSG 06/16/16 >> AHI 12, SpO2 low 79%  Cardiac tests: Echo 09/17/15 >> EF 65 to 70%, grade 1 DD, mild AR  Events:  Past Medical History: He  has a past medical history of Allergy, Chronic hepatitis C without mention of hepatic coma, Cirrhosis of liver without mention of alcohol, Diabetes mellitus, Full dentures, Hypertension, OSA (obstructive sleep apnea) (09/29/2016), 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  Past Surgical History: He  has a past surgical history that includes  Thyroid lobectomy (2005); Esophagogastroduodenoscopy; and Colonoscopy.  Family History: His family history includes Cancer in his father; Diabetes in his sister; Hypertension in his brother, father, mother, and sister; Stroke in his mother.  Social History: He  reports that he quit smoking about 38 years ago. he has never used smokeless tobacco. He reports that he does not drink alcohol or use drugs.  Medications: Allergies as of 01/26/2017      Reactions   Clonidine Derivatives    cramps      Medication List        Accurate as of 01/26/17 10:00 AM. Always use your most recent med list.          amLODipine 5 MG tablet Commonly known as:  NORVASC Take 1-2 tablets (5-10 mg total) by mouth every evening.   aspirin 81 MG tablet Take 81 mg by mouth daily.   fexofenadine 180 MG tablet Commonly known as:  ALLEGRA Take 180 mg by mouth daily as needed.   glimepiride 2 MG tablet Commonly known as:  AMARYL TAKE 1 TABLET BY MOUTH  DAILY BEFORE BREAKFAST   glucose blood test strip Commonly known as:  ONE TOUCH ULTRA TEST Use daily as needed for diabetes   ONE TOUCH ULTRA TEST test strip Generic drug:  glucose blood USE DAILY AS NEEDED FOR DIABETES   levocetirizine 5 MG tablet Commonly known as:  XYZAL Take 5  mg by mouth at bedtime as needed.   lisinopril 40 MG tablet Commonly known as:  PRINIVIL,ZESTRIL TAKE 1 TABLET (40 MG TOTAL) BY MOUTH DAILY.   metFORMIN 1000 MG tablet Commonly known as:  GLUCOPHAGE Take 1 tablet (1,000 mg total) by mouth 2 (two) times daily.   metoprolol succinate 100 MG 24 hr tablet Commonly known as:  TOPROL-XL TAKE 1 TABLET BY MOUTH  DAILY BUT IF BP IS GREATER  THAN 140/90 THEN TAKE 2  TABLETS BY MOUTH DAILY   montelukast 10 MG tablet Commonly known as:  SINGULAIR Take 10 mg by mouth at bedtime as needed.   multivitamin tablet Take 1 tablet by mouth daily.   ONETOUCH DELICA LANCETS 06T Misc Use daily to check sugar as needed.  Dx  E11.9   valsartan-hydrochlorothiazide 320-25 MG tablet Commonly known as:  DIOVAN-HCT TAKE 1 TABLET BY MOUTH  DAILY

## 2017-01-27 DIAGNOSIS — M722 Plantar fascial fibromatosis: Secondary | ICD-10-CM | POA: Diagnosis not present

## 2017-01-27 DIAGNOSIS — L03032 Cellulitis of left toe: Secondary | ICD-10-CM | POA: Diagnosis not present

## 2017-02-04 ENCOUNTER — Other Ambulatory Visit (INDEPENDENT_AMBULATORY_CARE_PROVIDER_SITE_OTHER): Payer: Medicare Other

## 2017-02-04 DIAGNOSIS — M109 Gout, unspecified: Secondary | ICD-10-CM

## 2017-02-04 DIAGNOSIS — E119 Type 2 diabetes mellitus without complications: Secondary | ICD-10-CM

## 2017-02-04 LAB — URIC ACID: URIC ACID, SERUM: 9.2 mg/dL — AB (ref 4.0–7.8)

## 2017-02-04 LAB — HEMOGLOBIN A1C: HEMOGLOBIN A1C: 6.9 % — AB (ref 4.6–6.5)

## 2017-02-06 ENCOUNTER — Ambulatory Visit: Payer: Medicare Other | Admitting: Family Medicine

## 2017-02-06 ENCOUNTER — Encounter: Payer: Self-pay | Admitting: Family Medicine

## 2017-02-06 VITALS — BP 128/78 | HR 62 | Temp 97.4°F | Wt 266.5 lb

## 2017-02-06 DIAGNOSIS — E119 Type 2 diabetes mellitus without complications: Secondary | ICD-10-CM | POA: Diagnosis not present

## 2017-02-06 DIAGNOSIS — Z125 Encounter for screening for malignant neoplasm of prostate: Secondary | ICD-10-CM

## 2017-02-06 NOTE — Patient Instructions (Addendum)
Don't change your meds but get back on your diet along with exercise.  Recheck in about 3-4 months at a medicare physical.   Update me as needed.

## 2017-02-06 NOTE — Progress Notes (Signed)
Diabetes:  Using medications without difficulties: yes Hypoglycemic episodes:no Hyperglycemic episodes:no Feet problems: prev had ingrown nail removed per podiatry.   Blood Sugars averaging: usually ~ 130s- 140s.  eye exam within last year: due, d/w pt.   Weight is up since the holidays, over the last 5 weeks.  His schedule has been off, diet has been off.  D/w pt.  He is going to get back on his schedule/diet/exercise.  A1c up some, but still controlled.   Work is busy but still going well.    He has been taking 10mg  of amlodipine if/when BP is elevated, 5mg  when SBP <140.  He wants to get his weight back down and re-taper dose.    Uric acid is back but no severe flares.  Likely weight related inc in level, d/w pt.   Meds, vitals, and allergies reviewed.   ROS: Per HPI unless specifically indicated in ROS section   GEN: nad, alert and oriented HEENT: mucous membranes moist NECK: supple w/o LA CV: rrr. PULM: ctab, no inc wob ABD: soft, +bs EXT: trace BLE edema  Diabetic foot exam: Normal inspection No skin breakdown No calluses  Normal DP pulses Normal sensation to light touch and monofilament Nails normal, L 1st nail with medial ingrown nail removed- healing well.

## 2017-02-08 NOTE — Assessment & Plan Note (Signed)
Recheck labs later on.  He is going to work more on diet and exercise.  No change in meds at this point.  See after visit summary.  He agrees.  Blood pressure will likely improve with weight loss, as would his uric acid.

## 2017-02-17 ENCOUNTER — Other Ambulatory Visit: Payer: Self-pay | Admitting: Family Medicine

## 2017-02-19 DIAGNOSIS — G4733 Obstructive sleep apnea (adult) (pediatric): Secondary | ICD-10-CM | POA: Diagnosis not present

## 2017-03-22 DIAGNOSIS — G4733 Obstructive sleep apnea (adult) (pediatric): Secondary | ICD-10-CM | POA: Diagnosis not present

## 2017-03-24 DIAGNOSIS — H903 Sensorineural hearing loss, bilateral: Secondary | ICD-10-CM | POA: Diagnosis not present

## 2017-03-24 DIAGNOSIS — H9313 Tinnitus, bilateral: Secondary | ICD-10-CM | POA: Diagnosis not present

## 2017-04-04 ENCOUNTER — Other Ambulatory Visit: Payer: Self-pay | Admitting: Family Medicine

## 2017-04-19 DIAGNOSIS — G4733 Obstructive sleep apnea (adult) (pediatric): Secondary | ICD-10-CM | POA: Diagnosis not present

## 2017-05-05 ENCOUNTER — Encounter: Payer: Self-pay | Admitting: Family Medicine

## 2017-05-12 ENCOUNTER — Telehealth: Payer: Self-pay

## 2017-05-12 NOTE — Telephone Encounter (Signed)
Patient notified that it is time to schedule his 6 month Korea.  He needs to check his schedule and wants to call me back.

## 2017-05-12 NOTE — Telephone Encounter (Signed)
-----   Message from Gatha Mayer, MD sent at 05/11/2017  4:59 PM EDT ----- Have reviewed  I do recommend a repeat limited RUQ [place] for cirrhosis again  Thanks ----- Message ----- From: Marlon Pel, RN Sent: 05/11/2017  10:55 AM To: Gatha Mayer, MD  Do you want to do [place]? Your note said consider ----- Message ----- From: Marlon Pel, RN Sent: 05/11/2017 To: Marlon Pel, RN  Ask if needs [place]   Gatha Mayer, MD sent to Tonia Ghent, MD Cc: Marlon Pel, RN    Cirrhosis but no signs portal htn  Do not think he needs an EGD to look for varices as did not have in 2015 and PLTS now NL - [facility] Eng' A   My Chart message to patient   Would consider repeat US every 6 mos to screen for [facility]  We will place a recall [employer] please do this)

## 2017-05-14 ENCOUNTER — Telehealth: Payer: Self-pay | Admitting: *Deleted

## 2017-05-14 NOTE — Telephone Encounter (Signed)
[facility] needs verification that the patient is on Lisinopril 40 mg and Valsartan/HCTZ 320-25.  According to the patient's current meds list, that is correct.  The pharmacy is asking which the patient is currently on.  PPW is in Dr. Josefine Class In [place].

## 2017-05-15 NOTE — Telephone Encounter (Signed)
He is on both.  Note added to paperwork.  Thanks.  Continue as is.

## 2017-05-15 NOTE — Telephone Encounter (Signed)
Faxed to pharmacy

## 2017-05-19 NOTE — Telephone Encounter (Signed)
No return call from the patient.  I will mail him a reminder letter

## 2017-05-20 DIAGNOSIS — G4733 Obstructive sleep apnea (adult) (pediatric): Secondary | ICD-10-CM | POA: Diagnosis not present

## 2017-05-21 ENCOUNTER — Telehealth: Payer: Self-pay

## 2017-05-21 DIAGNOSIS — B182 Chronic viral hepatitis C: Secondary | ICD-10-CM

## 2017-05-21 NOTE — Telephone Encounter (Signed)
Patient is scheduled for [place] at [facility] for 06/23/17 8:00.  He is asked to arrive at 7:45 and be NPO after midnight

## 2017-05-26 DIAGNOSIS — G4733 Obstructive sleep apnea (adult) (pediatric): Secondary | ICD-10-CM | POA: Diagnosis not present

## 2017-06-22 ENCOUNTER — Ambulatory Visit: Payer: Medicare Other

## 2017-06-22 ENCOUNTER — Encounter: Payer: Medicare Other | Admitting: Family Medicine

## 2017-06-22 DIAGNOSIS — R05 Cough: Secondary | ICD-10-CM | POA: Diagnosis not present

## 2017-06-22 DIAGNOSIS — J300 Vasomotor rhinitis: Secondary | ICD-10-CM | POA: Diagnosis not present

## 2017-06-22 DIAGNOSIS — H1045 Other chronic allergic conjunctivitis: Secondary | ICD-10-CM | POA: Diagnosis not present

## 2017-06-23 ENCOUNTER — Ambulatory Visit (HOSPITAL_COMMUNITY)
Admission: RE | Admit: 2017-06-23 | Discharge: 2017-06-23 | Disposition: A | Discharge status: Home / Self Care | Payer: Medicare Other | Source: Ambulatory Visit | Attending: Internal Medicine | Admitting: Internal Medicine

## 2017-06-23 DIAGNOSIS — B182 Chronic viral hepatitis C: Secondary | ICD-10-CM

## 2017-07-01 ENCOUNTER — Other Ambulatory Visit: Payer: Self-pay | Admitting: Family Medicine

## 2017-07-01 NOTE — Telephone Encounter (Signed)
Please schedule patient for Medicare physical. See last office notes. Send back for refill when appointment scheduled.

## 2017-07-01 NOTE — Telephone Encounter (Signed)
I left a detailed \\message  for patient to call back to schedule appointment and then we can refill medication. Just wanted to let you know per epic it shows an appointment in June with a Internal care office through [facility], not sure if he switched to them for a primary. Waiting to see if patient response back to [place]. Thank Edrick Kins, RMA

## 2017-07-02 NOTE — Telephone Encounter (Signed)
His wife was dismissed.  He wasn't dismissed.  I can still see him if he wants to come here.  We can refill this for 30 days if needing time to est at another clinic.  Or we can refill for longer if still a patient here.  Let me know what you hear and I'll work on it at that point.  Either way, I wish him the best.  Thanks.

## 2017-07-08 NOTE — Telephone Encounter (Signed)
Left message for patient to call back-Davan Nawabi V Ronita Hargreaves, RMA  

## 2017-07-10 NOTE — Telephone Encounter (Signed)
OK thank Edrick Kins, RMA

## 2017-07-10 NOTE — Telephone Encounter (Signed)
I sent this in case he needs refill.   I put a note on the rx: "We have tried to contact patient.  If patient is going to follow up at another clinic, then disregard this rx order." Thanks.

## 2017-07-10 NOTE — Telephone Encounter (Signed)
Dr Damita Dunnings, how would you like to proceed. Patient has not called me back. I called twice. First time over a week ago and then this week. Thank Edrick Kins, RMA

## 2017-07-13 ENCOUNTER — Ambulatory Visit (HOSPITAL_COMMUNITY)
Admission: RE | Admit: 2017-07-13 | Discharge: 2017-07-13 | Disposition: A | Discharge status: Home / Self Care | Payer: Medicare Other | Source: Ambulatory Visit | Attending: Internal Medicine | Admitting: Internal Medicine

## 2017-07-13 DIAGNOSIS — K7460 Unspecified cirrhosis of liver: Secondary | ICD-10-CM | POA: Diagnosis not present

## 2017-07-13 DIAGNOSIS — B182 Chronic viral hepatitis C: Secondary | ICD-10-CM | POA: Diagnosis not present

## 2017-07-15 DIAGNOSIS — M25552 Pain in left hip: Secondary | ICD-10-CM | POA: Diagnosis not present

## 2017-07-15 DIAGNOSIS — M79672 Pain in left foot: Secondary | ICD-10-CM | POA: Diagnosis not present

## 2017-07-15 DIAGNOSIS — I10 Essential (primary) hypertension: Secondary | ICD-10-CM | POA: Diagnosis not present

## 2017-07-15 DIAGNOSIS — E119 Type 2 diabetes mellitus without complications: Secondary | ICD-10-CM | POA: Diagnosis not present

## 2017-07-15 NOTE — Progress Notes (Signed)
Liver looks stable to better - they did not see gallstones this time If he has cirrhosis - it is very mild I see that he is overdue for a Medicare physical with Dr. Damita Dunnings - PCP - he should schedule one  I would like him to see me in August (or September)  I am not recommending a repeat US

## 2017-07-16 NOTE — Progress Notes (Signed)
I missed the new PCP so just f/u me

## 2017-08-22 ENCOUNTER — Other Ambulatory Visit: Payer: Self-pay | Admitting: Family Medicine

## 2017-08-24 NOTE — Telephone Encounter (Deleted)
Electronic refill request. [facility] Last office visit:   02/06/17  AVS states Recheck in about 3-4 months at a medicare physical.  No upcoming appts scheduled. Last Filled:    90 tablet 1 02/17/2017  Please advise.

## 2017-08-25 DIAGNOSIS — E785 Hyperlipidemia, unspecified: Secondary | ICD-10-CM | POA: Diagnosis not present

## 2017-08-25 DIAGNOSIS — I10 Essential (primary) hypertension: Secondary | ICD-10-CM | POA: Diagnosis not present

## 2017-08-25 DIAGNOSIS — E119 Type 2 diabetes mellitus without complications: Secondary | ICD-10-CM | POA: Diagnosis not present

## 2017-08-27 DIAGNOSIS — L282 Other prurigo: Secondary | ICD-10-CM | POA: Diagnosis not present

## 2017-08-27 DIAGNOSIS — L739 Follicular disorder, unspecified: Secondary | ICD-10-CM | POA: Diagnosis not present

## 2017-09-01 DIAGNOSIS — G4733 Obstructive sleep apnea (adult) (pediatric): Secondary | ICD-10-CM | POA: Diagnosis not present

## 2017-09-16 ENCOUNTER — Other Ambulatory Visit: Payer: Self-pay | Admitting: Family Medicine

## 2017-11-24 DIAGNOSIS — Z23 Encounter for immunization: Secondary | ICD-10-CM | POA: Diagnosis not present

## 2017-11-24 DIAGNOSIS — E119 Type 2 diabetes mellitus without complications: Secondary | ICD-10-CM | POA: Diagnosis not present

## 2017-11-24 DIAGNOSIS — Z0000 Encounter for general adult medical examination without abnormal findings: Secondary | ICD-10-CM | POA: Diagnosis not present

## 2017-11-24 DIAGNOSIS — I10 Essential (primary) hypertension: Secondary | ICD-10-CM | POA: Diagnosis not present

## 2017-11-24 DIAGNOSIS — E785 Hyperlipidemia, unspecified: Secondary | ICD-10-CM | POA: Diagnosis not present

## 2018-01-13 ENCOUNTER — Ambulatory Visit (HOSPITAL_COMMUNITY): Payer: Medicare Other | Attending: Cardiology

## 2018-01-13 ENCOUNTER — Other Ambulatory Visit: Payer: Self-pay

## 2018-01-13 DIAGNOSIS — I359 Nonrheumatic aortic valve disorder, unspecified: Secondary | ICD-10-CM | POA: Diagnosis present

## 2018-01-26 ENCOUNTER — Ambulatory Visit: Payer: Medicare Other | Admitting: Pulmonary Disease

## 2018-01-26 ENCOUNTER — Encounter: Payer: Self-pay | Admitting: Pulmonary Disease

## 2018-01-26 VITALS — BP 136/78 | HR 65 | Ht 75.0 in | Wt 264.8 lb

## 2018-01-26 DIAGNOSIS — Z9989 Dependence on other enabling machines and devices: Secondary | ICD-10-CM | POA: Diagnosis not present

## 2018-01-26 DIAGNOSIS — J310 Chronic rhinitis: Secondary | ICD-10-CM | POA: Diagnosis not present

## 2018-01-26 DIAGNOSIS — G4733 Obstructive sleep apnea (adult) (pediatric): Secondary | ICD-10-CM

## 2018-01-26 NOTE — Patient Instructions (Signed)
Will have your medical supply company change you to a nasal CPAP mask  Try using flonase daily until your sinus congestion is better, then as needed

## 2018-01-26 NOTE — Progress Notes (Signed)
[facility]  Chief Complaint  Patient presents with  . Follow-up    Pt is doing well overall with cpap machine.     Constitutional:  BP 136/78 (BP Location: Left Arm, Cuff Size: Normal)   Pulse 65   Ht 6\' 3"  (1.905 m)   Wt 264 lb 12.8 oz (120.1 kg)   SpO2 100%   BMI 33.10 kg/m   Past Medical History:  HTN, DM, Cirrhosis, Hep C, Allergies  Brief Summary:  Alexander [name] is a 67 y.o. male obstructive sleep apnea.  He has been getting more sinus congestion and post nasal drip since Thanksgiving.  This has made it difficult for him to use CPAP.  He does sleep better when he can use CPAP for whole night.  He is followed by Dr. Donneta Romberg for allergies.  He is using neti pot, singulair, and antihistamine pill.  He has flonase, but hasn't been using it.  He gets occasional mouth dryness.  CPAP download shows median CPAP 11 and 95 th percentile CPAP 13 with average AHI 0.9.   Physical Exam:   Appearance - well kempt   ENMT - boggy nasal mucosa, clear nasal drainage, midline nasal septum, no oral exudates, no LAN, trachea midline  Respiratory - normal chest wall, normal respiratory effort, no accessory muscle use, no wheeze/rales  CV - s1s2 regular rate and rhythm, no murmurs, no peripheral edema, radial pulses symmetric  GI - soft, non tender, no masses  Lymph - no adenopathy noted in neck and axillary areas  MSK - normal gait  Ext - no cyanosis, clubbing, or joint inflammation noted  Skin - no rashes, lesions, or ulcers  Neuro - normal strength, oriented x 3  Psych - normal mood and affect   Assessment/Plan:   Obstructive sleep apnea. - he is compliant with CPAP and reports benefit - continue auto CPAP - will try changing him to nasal mask  CPAP rhinitis. - advised him to try using flonase on regular basis until sinus symptoms improved  Allergic rhinitis and conjunctivitis. - he is followed by Dr. Mosetta Anis   Patient Instructions  Will have your medical supply company change you to a nasal CPAP mask  Try using flonase daily until your sinus congestion is better, then as needed    Chesley Mires, MD [place] Pager: [phone] 01/26/2018, 9:16 AM  Flow Sheet    Sleep tests:  PSG 06/16/16 >> AHI 12, SpO2 low 79%  Cardiac tests:  Echo 09/17/15 >> EF 65 to 70%, grade 1 DD, mild AR  Medications:   Allergies as of 01/26/2018      Reactions   Clonidine Derivatives    cramps      Medication List       Accurate as of January 26, 2018  9:16 AM. Always use your most recent med list.        amLODipine 5 MG tablet Commonly known as:  NORVASC TAKE 1-2 TABLETS BY MOUTH  EVERY EVENING.   aspirin 81 MG tablet Take 81 mg by mouth daily.   fexofenadine 180 MG tablet Commonly known as:  ALLEGRA Take 180 mg by mouth daily as needed.   glimepiride 2 MG tablet Commonly known as:  AMARYL TAKE 1 TABLET BY MOUTH  DAILY BEFORE BREAKFAST   glucose blood test strip Commonly known as:  ONE TOUCH ULTRA TEST Use daily as needed for diabetes   ONE TOUCH ULTRA TEST test strip Generic  drug:  glucose blood USE DAILY AS DIRECTED   levocetirizine 5 MG tablet Commonly known as:  XYZAL Take 5 mg by mouth at bedtime as needed.   metFORMIN 1000 MG tablet Commonly known as:  GLUCOPHAGE TAKE 1 TABLET BY MOUTH TWO  TIMES DAILY   metoprolol succinate 100 MG 24 hr tablet Commonly known as:  TOPROL-XL TAKE 1 TABLET BY MOUTH  DAILY BUT IF BP IS GREATER  THAN 140/90 THEN TAKE 2  TABLETS BY MOUTH DAILY   montelukast 10 MG tablet Commonly known as:  SINGULAIR Take 10 mg by mouth at bedtime as needed.   multivitamin tablet Take 1 tablet by mouth daily.   ONETOUCH DELICA LANCETS 55H Misc Use daily to check sugar as needed.  Dx E11.9   valsartan-hydrochlorothiazide 320-25 MG tablet Commonly known as:  DIOVAN-HCT TAKE 1 TABLET BY MOUTH  DAILY       Past  Surgical History:  He  has a past surgical history that includes Thyroid lobectomy (2005); Esophagogastroduodenoscopy; and Colonoscopy.  Family History:  His family history includes Cancer in his father; Diabetes in his sister; Hypertension in his brother, father, mother, and sister; Stroke in his mother.  Social History:  He  reports that he quit smoking about 39 years ago. He has never used smokeless tobacco. He reports that he does not drink alcohol or use drugs.

## 2018-03-03 NOTE — Progress Notes (Signed)
Patient ID: Alexander [name], male   DOB: [birth year], 68 y.o.   MRN: [id number]   68 y.o. initially seen for abnormal ECG 12/2013. Chronic LAD inferolateral T wave changes Previous smoker with HTN, DM And HLD. Previous hepatitis C Quit smoking over 40 years ago. Sees Dr Halford Chessman for OSA using CPAP  68 y.o.  referred by anesthesia Dr Al Corpus for abnormal ECG 12/2013  . Needed right knee arthroscopy with partial medial meniscectomy.  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  Wife has heart issues and sees Daneen Schick Not working out at [employer] as much due to left knee pain Has seen [place] before and has had cortisone injections   Discussed need for more activity and weight loss He has been battling a bone spur on his left foot  ROS: Denies fever, malais, weight loss, blurry vision, decreased visual acuity, cough, sputum, SOB, hemoptysis, pleuritic pain, palpitaitons, heartburn, abdominal pain, melena, lower extremity edema, claudication, or rash.  All other systems reviewed and negative   General: BP (!) 130/92   Pulse 66   Ht 6' 3.75" (1.924 m)   Wt 264 lb (119.7 kg)   BMI 32.35 kg/m  Affect appropriate Healthy:  appears stated age 68: normal Neck supple with no adenopathy JVP normal no bruits no thyromegaly Lungs clear with no wheezing and good diaphragmatic motion Heart:  S1/S2 SEM murmur, no rub, gallop or click PMI normal Abdomen: benighn, BS positve, no tenderness, no AAA no bruit.  No HSM or HJR Distal pulses intact with no bruits No edema Neuro non-focal Skin warm and dry No muscular weakness  Skin warm and dry No muscular weakness  Medications Current Outpatient Medications  Medication Sig Dispense Refill  . amLODipine (NORVASC) 5 MG tablet TAKE 1-2 TABLETS BY  MOUTH  EVERY EVENING. 180 tablet 3  . aspirin 81 MG tablet Take 81 mg by mouth daily.    . fexofenadine (ALLEGRA) 180 MG tablet Take 180 mg by mouth daily as needed.     Marland Kitchen glimepiride (AMARYL) 2 MG tablet TAKE 1 TABLET BY MOUTH  DAILY BEFORE BREAKFAST 90 tablet 2  . glucose blood (ONE TOUCH ULTRA TEST) test strip Use daily as needed for diabetes 100 each 1  . hydrochlorothiazide (HYDRODIURIL) 25 MG tablet Take 25 mg by mouth daily.    Marland Kitchen levocetirizine (XYZAL) 5 MG tablet Take 5 mg by mouth at bedtime as needed.   0  . lisinopril (PRINIVIL,ZESTRIL) 40 MG tablet Take 40 mg by mouth daily.    . metFORMIN (GLUCOPHAGE) 1000 MG tablet TAKE 1 TABLET BY MOUTH TWO  TIMES DAILY 180 tablet 3  . metoprolol succinate (TOPROL-XL) 100 MG 24 hr tablet TAKE 1 TABLET BY MOUTH  DAILY BUT IF BP IS GREATER  THAN 140/90 THEN TAKE 2  TABLETS BY MOUTH DAILY 180 tablet 0  . montelukast (SINGULAIR) 10 MG tablet Take 10 mg by mouth at bedtime as needed.     . Multiple Vitamin (MULTIVITAMIN) tablet Take 1 tablet by mouth daily.      . ONE TOUCH ULTRA TEST test strip USE DAILY AS DIRECTED 100 each 4  . ONETOUCH DELICA LANCETS 02D MISC Use daily to check sugar as needed.  Dx E11.9 100 each 3  . valsartan (DIOVAN) 320 MG  tablet Take 320 mg by mouth daily.    . valsartan-hydrochlorothiazide (DIOVAN-HCT) 320-25 MG tablet TAKE 1 TABLET BY MOUTH  DAILY 90 tablet 1   No current facility-administered medications for this visit.     Allergies Clonidine derivatives  Family History: Family History  Problem Relation Age of Onset  . Hypertension Mother   . Stroke Mother   . Cancer Father        stomach CA  . Hypertension Father   . Hypertension Sister   . Diabetes Sister   . Hypertension Brother   . Prostate cancer Neg Hx   . Colon cancer Neg Hx     Social History: Social History   Socioeconomic History  . Marital status: Married    Spouse name: [name]  . Number of children: Not on file  . Years of education: Not  on file  . Highest education level: Not on file  Occupational History  . Occupation: Retired    Fish farm manager: [employer]    Comment: Also a Clinical biochemist at [employer]  . Financial resource strain: Not on file  . Food insecurity:    Worry: Not on file    Inability: Not on file  . Transportation needs:    Medical: Not on file    Non-medical: Not on file  Tobacco Use  . Smoking status: Former Smoker    Last attempt to quit: 02/10/1978    Years since quitting: 40.0  . Smokeless tobacco: Never Used  Substance and Sexual Activity  . Alcohol use: No    Alcohol/week: 0.0 standard drinks  . Drug use: No  . Sexual activity: Not on file  Lifestyle  . Physical activity:    Days per week: Not on file    Minutes per session: Not on file  . Stress: Not on file  Relationships  . Social connections:    Talks on phone: Not on file    Gets together: Not on file    Attends religious service: Not on file    Active member of club or organization: Not on file    Attends meetings of clubs or organizations: Not on file    Relationship status: Not on file  . Intimate partner violence:    Fear of current or ex partner: Not on file    Emotionally abused: Not on file    Physically abused: Not on file    Forced sexual activity: Not on file  Other Topics Concern  . Not on file  Social History Narrative   Married 1978   Chaplain, at [employer].     He is retired from [employer]    Past Surgical History:  Procedure Laterality Date  . COLONOSCOPY    . ESOPHAGOGASTRODUODENOSCOPY     multiple  . THYROID LOBECTOMY  2005   right    Past Medical History:  Diagnosis Date  . Allergy   . Chronic hepatitis C without mention of hepatic coma    treatment at [facility]  . 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 Diabetes mellitus    type II  . Full dentures   . Hypertension   . OSA (obstructive sleep apnea) 09/29/2016  . 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 Wears glasses      Electrocardiogram:  03/08/18 SR rate 66 nonspecific ST changes insignificant Q waves 3,F  Assessment and Plan  Abnormal ECG chronic no evidence of MI or structural abnormality on echo/ myovue AV Disease  no change in murmur AV sclerosis /Mild AR by TTE 01/13/18 stable since 2017 HTN: Well controlled.  Continue current medications and low sodium Dash type diet.   DM: Discussed low carb diet.  Target hemoglobin A1c is 6.5 or less.  Continue current medications. Ortho:  Encouraged him to see f/u consider mobic for pain needs xrays and evaluation for possible TKR   Jenkins Rouge

## 2018-03-08 ENCOUNTER — Ambulatory Visit: Payer: Medicare Other | Admitting: Cardiovascular Disease

## 2018-03-08 VITALS — BP 130/92 | HR 66 | Ht 75.75 in | Wt 264.0 lb

## 2018-03-08 DIAGNOSIS — I359 Nonrheumatic aortic valve disorder, unspecified: Secondary | ICD-10-CM | POA: Diagnosis not present

## 2018-03-08 DIAGNOSIS — I10 Essential (primary) hypertension: Secondary | ICD-10-CM | POA: Diagnosis not present

## 2018-03-08 DIAGNOSIS — R9431 Abnormal electrocardiogram [ECG] [EKG]: Secondary | ICD-10-CM

## 2018-03-08 NOTE — Patient Instructions (Addendum)

## 2018-04-13 ENCOUNTER — Other Ambulatory Visit: Payer: Self-pay | Admitting: Family Medicine

## 2018-09-17 ENCOUNTER — Other Ambulatory Visit: Payer: Self-pay

## 2018-09-17 DIAGNOSIS — Z20822 Contact with and (suspected) exposure to covid-19: Secondary | ICD-10-CM

## 2018-09-18 LAB — NOVEL CORONAVIRUS, NAA: SARS-CoV-2, NAA: NOT DETECTED

## 2018-10-26 ENCOUNTER — Other Ambulatory Visit (INDEPENDENT_AMBULATORY_CARE_PROVIDER_SITE_OTHER): Payer: Medicare Other

## 2018-10-26 ENCOUNTER — Ambulatory Visit (INDEPENDENT_AMBULATORY_CARE_PROVIDER_SITE_OTHER): Payer: Medicare Other | Admitting: Internal Medicine

## 2018-10-26 ENCOUNTER — Encounter: Payer: Self-pay | Admitting: Internal Medicine

## 2018-10-26 ENCOUNTER — Other Ambulatory Visit: Payer: Self-pay

## 2018-10-26 VITALS — BP 118/72 | HR 58 | Temp 97.9°F | Ht 75.75 in | Wt 272.1 lb

## 2018-10-26 DIAGNOSIS — K7469 Other cirrhosis of liver: Secondary | ICD-10-CM

## 2018-10-26 DIAGNOSIS — B182 Chronic viral hepatitis C: Secondary | ICD-10-CM

## 2018-10-26 LAB — CBC WITH DIFFERENTIAL/PLATELET
Basophils Absolute: 0.1 10*3/uL (ref 0.0–0.1)
Basophils Relative: 1.4 % (ref 0.0–3.0)
Eosinophils Absolute: 0.1 10*3/uL (ref 0.0–0.7)
Eosinophils Relative: 2.1 % (ref 0.0–5.0)
HCT: 40.7 % (ref 39.0–52.0)
Hemoglobin: 13.4 g/dL (ref 13.0–17.0)
Lymphocytes Relative: 46.4 % — ABNORMAL HIGH (ref 12.0–46.0)
Lymphs Abs: 2 10*3/uL (ref 0.7–4.0)
MCHC: 33 g/dL (ref 30.0–36.0)
MCV: 80.9 fl (ref 78.0–100.0)
Monocytes Absolute: 0.4 10*3/uL (ref 0.1–1.0)
Monocytes Relative: 9.6 % (ref 3.0–12.0)
Neutro Abs: 1.8 10*3/uL (ref 1.4–7.7)
Neutrophils Relative %: 40.5 % — ABNORMAL LOW (ref 43.0–77.0)
Platelets: 174 10*3/uL (ref 150.0–400.0)
RBC: 5.03 Mil/uL (ref 4.22–5.81)
RDW: 14.7 % (ref 11.5–15.5)
WBC: 4.4 10*3/uL (ref 4.0–10.5)

## 2018-10-26 LAB — PROTIME-INR
INR: 1.3 ratio — ABNORMAL HIGH (ref 0.8–1.0)
Prothrombin Time: 15.3 s — ABNORMAL HIGH (ref 9.6–13.1)

## 2018-10-26 NOTE — Patient Instructions (Addendum)
  Your provider has requested that you go to the basement level for lab work before leaving today. Press "B" on the elevator. The lab is located at the first door on the left as you exit the elevator.   You have been scheduled for an endoscopy. Please follow written instructions given to you at your visit today. If you use inhalers (even only as needed), please bring them with you on the day of your procedure.  You have been scheduled for an abdominal ultrasound at [facility] on 11/01/2018 at 8:45AM. Please arrive 20 minutes prior to your appointment for registration. Make certain not to have anything to eat or drink 6 hours prior to your appointment. Should you need to reschedule your appointment, please contact radiology at [phone] This test typically takes about 30 minutes to perform.  I appreciate the opportunity to care for you. Silvano Rusk, MD, [facility]

## 2018-10-26 NOTE — Assessment & Plan Note (Signed)
Cause of cirrhosis we think

## 2018-10-26 NOTE — Progress Notes (Signed)
Brynn Oswald Sudano 68 y.o. Jan 15, 1951 [id number]  Assessment & Plan:  Hepatic cirrhosis ([place]) thought secondary to HCV Orders Placed This Encounter  Procedures  . US Abdomen Limited RUQ  . CBC with Differential/Platelet  . Protime-INR  . AFP tumor marker   Upper endoscopy to screen for varices also.  I think a statin would be acceptable in his case with compensated cirrhosis.  Chronic hepatitis C virus infection - treated and in remission since 2014 Cause of cirrhosis we think     I appreciate the opportunity to care for this patient. CC: Nicola Girt, DO   Subjective:   Chief Complaint: Follow-up of cirrhosis  HPI The patient is here for follow-up, he has a history of hepatitis C successfully treated and child's a cirrhosis thought secondary to that.  My last contact with him was through an ultrasound in June 2019 that showed stable cirrhosis and no liver lesions.  He reports no leg swelling increasing abdominal girth or problems.  I reviewed his recent primary care note with Dr. Suzy Bouchard, at [facility], from June of this year.  His LFTs remain normal.  He did not have a platelet count with his CBC.  No recent coags.  His diabetes mellitus is not completely controlled she would like him to take a statin but he is declined due to cirrhosis or she has deferred.  His last EGD was in 2015 and no varices.  His last colonoscopy is on the same day in June 2015 and no neoplasia he had 2 distal hyperplastic polyps.  He was last seen in the office in September 2018.  Wt Readings from Last 3 Encounters:  10/26/18 272 lb 2 oz (123.4 kg)  03/08/18 264 lb (119.7 kg)  01/26/18 264 lb 12.8 oz (120.1 kg)   Hemoglobin A1c 7% a little over a year ago now up to 7.8%.  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 Clonidine Derivatives     cramps   Current Meds  Medication Sig  . amLODipine (NORVASC) 5 MG tablet TAKE 1-2 TABLETS BY MOUTH  EVERY EVENING. (Patient taking differently: 10 mg. )  . aspirin 81 MG tablet Take 81 mg by mouth daily.  Marland Kitchen azelastine (OPTIVAR) 0.05 % ophthalmic solution   . fexofenadine (ALLEGRA) 180 MG tablet Take 180 mg by mouth daily as needed.   Marland Kitchen glimepiride (AMARYL) 2 MG tablet TAKE 1 TABLET BY MOUTH  DAILY BEFORE BREAKFAST (Patient taking differently: 2 mg daily before breakfast. 2 tablets every morning)  . glucose blood (ONE TOUCH ULTRA TEST) test strip Use daily as needed for diabetes  . hydrochlorothiazide (HYDRODIURIL) 25 MG tablet Take 25 mg by mouth daily.  Marland Kitchen LANTUS SOLOSTAR 100 UNIT/ML Solostar Pen ADM 10 UNI [place] Q NIGHT  . levocetirizine (XYZAL) 5 MG tablet Take 5 mg by mouth at bedtime as needed.   . metFORMIN (GLUCOPHAGE) 1000 MG tablet TAKE 1 TABLET BY MOUTH TWO  TIMES DAILY  . metoprolol succinate (TOPROL-XL) 100 MG 24 hr tablet TAKE 1 TABLET BY MOUTH  DAILY BUT IF BP IS GREATER  THAN 140/90 THEN TAKE 2  TABLETS BY MOUTH DAILY  . montelukast (SINGULAIR) 10 MG tablet Take 10 mg by mouth at bedtime as needed.   . Multiple Vitamin (MULTIVITAMIN) tablet Take 1 tablet by mouth daily.    Marland Kitchen  ONE TOUCH ULTRA TEST test strip USE DAILY AS DIRECTED  . ONETOUCH DELICA LANCETS 99991111 MISC Use daily to check sugar as needed.  Dx E11.9  . spironolactone (ALDACTONE) 25 MG tablet   . valsartan (DIOVAN) 320 MG tablet Take 320 mg by mouth daily.   Past Medical History:  Diagnosis Date  . Allergy   . Chronic hepatitis C without mention of hepatic coma    treatment at [facility]  . 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 Diabetes mellitus    type II  . Full dentures   . Hypertension   . OSA (obstructive sleep apnea) 09/29/2016  . 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 Wears glasses    Past Surgical History:  Procedure Laterality Date  . COLONOSCOPY    .  ESOPHAGOGASTRODUODENOSCOPY     multiple  . THYROID LOBECTOMY  2005   right   Social History   Social History Narrative   Married 1978, [name]   Grown children - 3 sons 4 grandkids   Clinical biochemist, at [employer].  Works at [employer] as well   He is retired from [employer]   Former smoker, no alcohol no drug use   family history includes Cancer in his father; Diabetes in his sister; Hypertension in his brother, father, mother, and sister; Stroke in his mother.   Review of Systems As per HPI  Objective:   Physical Exam @BP  118/72 (BP Location: Left Arm, Patient Position: Sitting, Cuff Size: Large)   Pulse (!) 58   Temp 97.9 F (36.6 C) (Oral)   Ht 6' 3.75" (1.924 m)   Wt 272 lb 2 oz (123.4 kg)   BMI 33.34 kg/m @  General:  Well-developed, well-nourished and in no acute distress Eyes:  anicteric. Lungs: Clear to auscultation bilaterally. Heart:  S1S2, no rubs, murmurs, gallops. Abdomen:  soft, non-tender, no hepatosplenomegaly, hernia, or mass and BS+.  Extremities:   no edema, cyanosis or clubbing Skin   no rash.  I do not see any stigmata of chronic liver disease Neuro:  A&O x 3.  Psych:  appropriate mood and  Affect.   Data Reviewed: See HPI Recent cardiology note also reviewed

## 2018-10-26 NOTE — Assessment & Plan Note (Addendum)
Orders Placed This Encounter  Procedures  . US Abdomen Limited RUQ  . CBC with Differential/Platelet  . Protime-INR  . AFP tumor marker   Upper endoscopy to screen for varices also.  I think a statin would be acceptable in his case with compensated cirrhosis.

## 2018-10-27 LAB — AFP TUMOR MARKER: AFP-Tumor Marker: 11.6 ng/mL — ABNORMAL HIGH (ref <6.1)

## 2018-10-27 NOTE — Progress Notes (Signed)
Alexander [name],  Labs remain stable with some mild abnormalities but overall things remain good.  See you 9/28  Best regards,  Gatha Mayer, MD, [facility]

## 2018-11-01 ENCOUNTER — Ambulatory Visit
Admission: RE | Admit: 2018-11-01 | Discharge: 2018-11-01 | Disposition: A | Discharge status: Home / Self Care | Payer: Medicare Other | Source: Ambulatory Visit | Attending: Internal Medicine | Admitting: Internal Medicine

## 2018-11-01 DIAGNOSIS — K7469 Other cirrhosis of liver: Secondary | ICD-10-CM

## 2018-11-01 DIAGNOSIS — B182 Chronic viral hepatitis C: Secondary | ICD-10-CM

## 2018-11-08 ENCOUNTER — Ambulatory Visit (AMBULATORY_SURGERY_CENTER): Payer: Medicare Other | Admitting: Internal Medicine

## 2018-11-08 ENCOUNTER — Encounter: Payer: Self-pay | Admitting: Internal Medicine

## 2018-11-08 ENCOUNTER — Other Ambulatory Visit: Payer: Self-pay

## 2018-11-08 VITALS — BP 108/64 | HR 60 | Temp 98.2°F | Resp 18 | Ht 75.75 in | Wt 272.0 lb

## 2018-11-08 DIAGNOSIS — K2970 Gastritis, unspecified, without bleeding: Secondary | ICD-10-CM

## 2018-11-08 DIAGNOSIS — K3189 Other diseases of stomach and duodenum: Secondary | ICD-10-CM

## 2018-11-08 DIAGNOSIS — K7469 Other cirrhosis of liver: Secondary | ICD-10-CM | POA: Diagnosis not present

## 2018-11-08 DIAGNOSIS — B1920 Unspecified viral hepatitis C without hepatic coma: Secondary | ICD-10-CM | POA: Diagnosis not present

## 2018-11-08 MED ORDER — SODIUM CHLORIDE 0.9 % IV SOLN: 500.0000 mL | Freq: Once | INTRAVENOUS | Status: DC

## 2018-11-08 NOTE — Progress Notes (Signed)
A and O x3. Report to RN. Tolerated MAC anesthesia well.Teeth unchanged after procedure.

## 2018-11-08 NOTE — Progress Notes (Signed)
Called to room to assist during endoscopic procedure.  Patient ID and intended procedure confirmed with present staff. Received instructions for my participation in the procedure from the performing physician.  

## 2018-11-08 NOTE — Op Note (Signed)
[facility] Patient Name: Alexander [name] Procedure Date: 11/08/2018 10:12 AM MRN: [id number] Endoscopist: Gatha Mayer , MD Age: 68 Referring MD:  Date of Birth: 10/17/1950 Gender: Male Account #: [id number] Procedure:                Upper GI endoscopy Indications:              Cirrhosis rule out esophageal varices Medicines:                Propofol per Anesthesia, Monitored Anesthesia Care Procedure:                Pre-Anesthesia Assessment:                           - Prior to the procedure, a History and Physical                            was performed, and patient medications and                            allergies were reviewed. The patient's tolerance of                            previous anesthesia was also reviewed. The risks                            and benefits of the procedure and the sedation                            options and risks were discussed with the patient.                            All questions were answered, and informed consent                            was obtained. Prior Anticoagulants: The patient has                            taken no previous anticoagulant or antiplatelet                            agents. ASA Grade Assessment: III - A patient with                            severe systemic disease. After reviewing the risks                            and benefits, the patient was deemed in                            satisfactory condition to undergo the procedure.                           After obtaining informed consent, the endoscope was  passed under direct vision. Throughout the                            procedure, the patient's blood pressure, pulse, and                            oxygen saturations were monitored continuously. The                            Endoscope was introduced through the mouth, and                            advanced to the second part of duodenum. The upper            GI endoscopy was accomplished without difficulty.                            The patient tolerated the procedure well. Scope In: Scope Out: Findings:                 The examined esophagus was normal.                           Patchy mild inflammation characterized by adherent                            blood, congestion (edema), erosions and erythema                            was found in the gastric body and in the gastric                            antrum. Biopsies were taken with a cold forceps for                            histology. Verification of patient identification                            for the specimen was done. Estimated blood loss was                            minimal.                           The exam was otherwise without abnormality.                           The cardia and gastric fundus were normal on                            retroflexion. Complications:            No immediate complications. Estimated Blood Loss:     Estimated blood loss was minimal. Impression:               - Normal esophagus.                           -  Gastritis. Biopsied. ? H pylori - is on 81 mg                            ASA/day                           - The examination was otherwise normal. Recommendation:           - Patient has a contact number available for                            emergencies. The signs and symptoms of potential                            delayed complications were discussed with the                            patient. Return to normal activities tomorrow.                            Written discharge instructions were provided to the                            patient.                           - Resume previous diet.                           - Continue present medications.                           - Await pathology results. Gatha Mayer, MD 11/08/2018 10:44:01 AM This report has been signed electronically.

## 2018-11-08 NOTE — Patient Instructions (Addendum)
There is some inflammation in the stomach.  I took biopsies to see why and will let you know.  No problems from the liver - i.e. no varices (swollen veins)  I appreciate the opportunity to care for you. Gatha Mayer, MD, FACG YOU HAD AN ENDOSCOPIC PROCEDURE TODAY AT [facility]:   Refer to the procedure report that was given to you for any specific questions about what was found during the examination.  If the procedure report does not answer your questions, please call your gastroenterologist to clarify.  If you requested that your care partner not be given the details of your procedure findings, then the procedure report has been included in a sealed envelope for you to review at your convenience later.  YOU SHOULD EXPECT: Some feelings of bloating in the abdomen. Passage of more gas than usual.  Walking can help get rid of the air that was put into your GI tract during the procedure and reduce the bloating. If you had a lower endoscopy (such as a colonoscopy or flexible sigmoidoscopy) you may notice spotting of blood in your stool or on the toilet paper. If you underwent a bowel prep for your procedure, you may not have a normal bowel movement for a few days.  Please Note:  You might notice some irritation and congestion in your nose or some drainage.  This is from the oxygen used during your procedure.  There is no need for concern and it should clear up in a day or so.  SYMPTOMS TO REPORT IMMEDIATELY:    Following upper endoscopy (EGD)  Vomiting of blood or coffee ground material  New chest pain or pain under the shoulder blades  Painful or persistently difficult swallowing  New shortness of breath  Fever of 100F or higher  Black, tarry-looking stools  For urgent or emergent issues, a gastroenterologist can be reached at any hour by calling [phone].   DIET:  We do recommend a small meal at first, but then you may proceed to your regular diet.  Drink  plenty of fluids but you should avoid alcoholic beverages for 24 hours.  MEDICATIONS: Continue present medications.  Please see handouts given to you by your recovery nurse.  ACTIVITY:  You should plan to take it easy for the rest of today and you should NOT DRIVE or use heavy machinery until tomorrow (because of the sedation medicines used during the test).    FOLLOW UP: Our staff will call the number listed on your records 48-72 hours following your procedure to check on you and address any questions or concerns that you may have regarding the information given to you following your procedure. If we do not reach you, we will leave a message.  We will attempt to reach you two times.  During this call, we will ask if you have developed any symptoms of COVID 19. If you develop any symptoms (ie: fever, flu-like symptoms, shortness of breath, cough etc.) before then, please call [phone].  If you test positive for Covid 19 in the 2 weeks post procedure, please call and report this information to [place].    If any biopsies were taken you will be contacted by phone or by letter within the next 1-3 weeks.  Please call us at [phone] if you have not heard about the biopsies in 3 weeks.   Thank you for allowing [place] to provide for your healthcare needs today.   SIGNATURES/CONFIDENTIALITY: You and/or your care  partner have signed paperwork which will be entered into your electronic medical record.  These signatures attest to the fact that that the information above on your After Visit Summary has been reviewed and is understood.  Full responsibility of the confidentiality of this discharge information lies with you and/or your care-partner.

## 2018-11-09 NOTE — Progress Notes (Signed)
I have told patient [place] ok - stable Please place a reminder to repeat in 6 mos

## 2018-11-10 ENCOUNTER — Telehealth: Payer: Self-pay | Admitting: *Deleted

## 2018-11-10 NOTE — Telephone Encounter (Signed)
  Follow up Call-  Call back number 11/08/2018  Post procedure Call Back phone  # [phone])  Permission to leave phone message Yes  Some recent data might be hidden     Patient questions:  Do you have a fever, pain , or abdominal swelling? No. Pain Score  0 *  Have you tolerated food without any problems? Yes.    Have you been able to return to your normal activities? Yes.    Do you have any questions about your discharge instructions: Diet   No. Medications  No. Follow up visit  No.  Do you have questions or concerns about your Care? No.  Actions: * If pain score is 4 or above: No action needed, pain <4. 1. Have you developed a fever since your procedure? no  2.   Have you had an respiratory symptoms (SOB or cough) since your procedure? no  3.   Have you tested positive for COVID 19 since your procedure no  4.   Have you had any family members/close contacts diagnosed with the COVID 19 since your procedure?  no   If yes to any of these questions please route to Joylene John, RN and Alphonsa Gin, Therapist, sports.

## 2018-11-18 ENCOUNTER — Encounter: Payer: Self-pay | Admitting: Internal Medicine

## 2018-11-18 NOTE — Progress Notes (Signed)
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

## 2018-12-03 ENCOUNTER — Other Ambulatory Visit: Payer: Self-pay

## 2018-12-03 DIAGNOSIS — Z20822 Contact with and (suspected) exposure to covid-19: Secondary | ICD-10-CM

## 2018-12-05 LAB — NOVEL CORONAVIRUS, NAA: SARS-CoV-2, NAA: NOT DETECTED

## 2019-01-24 ENCOUNTER — Other Ambulatory Visit: Payer: Self-pay

## 2019-01-24 ENCOUNTER — Encounter: Payer: Self-pay | Admitting: Pulmonary Disease

## 2019-01-24 ENCOUNTER — Ambulatory Visit: Payer: Medicare Other | Admitting: Pulmonary Disease

## 2019-01-24 VITALS — BP 122/80 | HR 56 | Temp 97.4°F | Ht 75.0 in | Wt 279.8 lb

## 2019-01-24 DIAGNOSIS — G4730 Sleep apnea, unspecified: Secondary | ICD-10-CM | POA: Diagnosis not present

## 2019-01-24 DIAGNOSIS — H6121 Impacted cerumen, right ear: Secondary | ICD-10-CM | POA: Diagnosis not present

## 2019-01-24 DIAGNOSIS — Z9989 Dependence on other enabling machines and devices: Secondary | ICD-10-CM

## 2019-01-24 DIAGNOSIS — G4733 Obstructive sleep apnea (adult) (pediatric): Secondary | ICD-10-CM

## 2019-01-24 DIAGNOSIS — E669 Obesity, unspecified: Secondary | ICD-10-CM

## 2019-01-24 DIAGNOSIS — R682 Dry mouth, unspecified: Secondary | ICD-10-CM

## 2019-01-24 NOTE — Patient Instructions (Signed)
Can try increasing your CPAP humidifier setting to help with mouth dryness  You can also try biotene mouth rinse as needed  Follow up in 1 year

## 2019-01-24 NOTE — Progress Notes (Signed)
[facility]  Chief Complaint  Patient presents with  . Follow-up    Patient is here for follow up. Patient feels good overall only complaint is a dry mouth with CPAP use.     Constitutional:  BP 122/80 (BP Location: Right Arm, Patient Position: Sitting, Cuff Size: Normal)   Pulse (!) 56   Temp (!) 97.4 F (36.3 C) (Temporal)   Ht 6\' 3"  (1.905 m)   Wt 279 lb 12.8 oz (126.9 kg)   SpO2 97% Comment: on RA  BMI 34.97 kg/m   Past Medical History:  HTN, DM, Cirrhosis, Hep C, Allergies  Brief Summary:  Alexander [name] is a 68 y.o. male obstructive sleep apnea.  He uses CPAP most nights.  Notices needing to use bathroom more when he forgets to put CPAP on.  He is a mouth breather.  Tried nasal mask, but didn't work.  Now using hybrid mask.  This fits well and no air leak.  Has noticed dry mouth more so since the weather has gotten colder and has heater on in his house.  Uses allegra and xyzal at night for allergies and skin lesions.    Was started on insulin recently.  Has noticed decreased hearing acuity in Rt ear.   Physical Exam:   Appearance - well kempt   ENMT - cerumen impaction Rt ear, no sinus tenderness, prominent nasal turbinates, no nasal discharge, no oral exudate, MP 3, elongated uvula  Neck - no masses, trachea midline, no thyromegaly, no elevation in JVP  Respiratory - normal appearance of chest wall, normal respiratory effort w/o accessory muscle use, no dullness on percussion, no wheezing or rales  CV - s1s2 regular rate and rhythm, no murmurs, no peripheral edema, radial pulses symmetric  GI - soft, non tender  Lymph - no adenopathy noted in neck and axillary areas  MSK - normal gait  Ext - no cyanosis, clubbing, or joint inflammation noted  Skin - no rashes, lesions, or ulcers  Neuro - normal strength, oriented x 3  Psych - normal mood and affect   Assessment/Plan:   Obstructive sleep apnea. - he is  compliant with CPAP and reports benefit from therapy - continue auto CPAP  Dry mouth. - reviewed medications that could be contributing to this - advised him to try adjusting humidifier setting up - prn biotene  Cerumen impaction Rt ear. - f/u with PCP  Obesity. - discussed options to assist with weight loss, including diet modification and exercise regimen  Allergic rhinitis and conjunctivitis. - he is followed by Dr. Mosetta Anis  Hep C with cirrhosis. - followed by Dr. Silvano Rusk with GI   Patient Instructions  Can try increasing your CPAP humidifier setting to help with mouth dryness  You can also try biotene mouth rinse as needed  Follow up in 1 year    Chesley Mires, MD [place] Pager: [phone] 01/24/2019, 9:22 AM  Flow Sheet    Sleep tests:  PSG 06/16/16 >> AHI 12, SpO2 low 79% Auto CPAP 12/22/18 to 01/20/19 >> used on 27 of 30 nights with average 7 hrs 15 min.  Average AHI 1.3 with median CPAP 12 and 95 th percentile CPAP 14 cm H2O.  No leak.  Cardiac tests:  Echo 01/13/18 >> EF 60 to 65%, grade 1, mild AR  Medications:   Allergies as of 01/24/2019      Reactions   Clonidine Derivatives    cramps  Medication List       Accurate as of January 24, 2019  9:22 AM. If you have any questions, ask your nurse or doctor.        amLODipine 5 MG tablet Commonly known as: NORVASC TAKE 1-2 TABLETS BY MOUTH  EVERY EVENING. What changed: See the new instructions.   aspirin 81 MG tablet Take 81 mg by mouth daily.   azelastine 0.05 % ophthalmic solution Commonly known as: OPTIVAR   fexofenadine 180 MG tablet Commonly known as: ALLEGRA Take 180 mg by mouth daily as needed.   glimepiride 2 MG tablet Commonly known as: AMARYL TAKE 1 TABLET BY MOUTH  DAILY BEFORE BREAKFAST What changed: See the new instructions.   glucose blood test strip Commonly known as: ONE TOUCH ULTRA TEST Use daily as needed for diabetes     ONE TOUCH ULTRA TEST test strip Generic drug: glucose blood USE DAILY AS DIRECTED   hydrochlorothiazide 25 MG tablet Commonly known as: HYDRODIURIL Take 25 mg by mouth daily.   Lantus SoloStar 100 UNIT/ML Solostar Pen Generic drug: Insulin Glargine ADM 10 UNI [place] Q NIGHT   levocetirizine 5 MG tablet Commonly known as: XYZAL Take 5 mg by mouth at bedtime as needed.   metFORMIN 1000 MG tablet Commonly known as: GLUCOPHAGE TAKE 1 TABLET BY MOUTH TWO  TIMES DAILY   metoprolol succinate 100 MG 24 hr tablet Commonly known as: TOPROL-XL TAKE 1 TABLET BY MOUTH  DAILY BUT IF BP IS GREATER  THAN 140/90 THEN TAKE 2  TABLETS BY MOUTH DAILY   montelukast 10 MG tablet Commonly known as: SINGULAIR Take 10 mg by mouth at bedtime as needed.   multivitamin tablet Take 1 tablet by mouth daily.   OneTouch Delica Lancets 99991111 Misc Use daily to check sugar as needed.  Dx E11.9   spironolactone 25 MG tablet Commonly known as: ALDACTONE   valsartan 320 MG tablet Commonly known as: DIOVAN Take 320 mg by mouth daily.       Past Surgical History:  He  has a past surgical history that includes Thyroid lobectomy (2005); Esophagogastroduodenoscopy; and Colonoscopy.  Family History:  His family history includes Cancer in his father; Diabetes in his sister; Hypertension in his brother, father, mother, and sister; Stomach cancer in his father; Stroke in his mother.  Social History:  He  reports that he quit smoking about 40 years ago. He has never used smokeless tobacco. He reports that he does not drink alcohol or use drugs.

## 2019-03-25 ENCOUNTER — Other Ambulatory Visit: Payer: Self-pay | Admitting: *Deleted

## 2019-03-25 DIAGNOSIS — Z20822 Contact with and (suspected) exposure to covid-19: Secondary | ICD-10-CM

## 2019-03-26 LAB — NOVEL CORONAVIRUS, NAA: SARS-CoV-2, NAA: NOT DETECTED

## 2019-04-04 NOTE — Progress Notes (Signed)
Patient ID: Alexander [name], male   DOB: [birth year], 69 y.o.   MRN: [id number]   68 y.o. initially seen for abnormal ECG 12/2013. Chronic LAD inferolateral T wave changes Previous smoker with HTN, DM And HLD. Previous hepatitis C Quit smoking over 40 years ago. 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  All other systems reviewed and negative   General: BP 134/86   Pulse 65   Ht 6\' 3"  (1.905 m)   Wt 277 lb (125.6 kg)   SpO2 98%   BMI 34.62 kg/m  Affect appropriate Healthy:  appears stated age 69: normal Neck supple with no adenopathy JVP normal no bruits no thyromegaly Lungs clear with no wheezing and good diaphragmatic motion Heart:  S1/S2 SEM murmur, no rub, gallop or click PMI normal Abdomen: benighn, BS positve, no tenderness, no AAA no bruit.  No HSM or HJR Distal pulses intact with no bruits No edema Neuro non-focal Skin warm and dry No muscular weakness  Skin warm and dry No muscular weakness  Medications Current Outpatient Medications  Medication Sig Dispense Refill  . amLODipine (NORVASC) 10 MG tablet Take 10 mg by mouth daily.    Marland Kitchen aspirin 81 MG tablet Take 81 mg by mouth daily.    Marland Kitchen azelastine (OPTIVAR) 0.05 % ophthalmic solution     . fexofenadine (ALLEGRA) 180 MG tablet Take 180 mg by mouth daily as needed.     Marland Kitchen glimepiride (AMARYL) 2 MG tablet TAKE 1 TABLET BY MOUTH  DAILY BEFORE  BREAKFAST (Patient taking differently: 2 mg daily before breakfast. 2 tablets every morning) 90 tablet 2  . glucose blood (ONE TOUCH ULTRA TEST) test strip Use daily as needed for diabetes 100 each 1  . hydrochlorothiazide (HYDRODIURIL) 25 MG tablet Take 25 mg by mouth daily.    . Insulin Glargine (LANTUS) 100 UNIT/ML Solostar Pen Inject 15 Units into the skin daily.    Marland Kitchen levocetirizine (XYZAL) 5 MG tablet Take 5 mg by mouth at bedtime as needed.   0  . metFORMIN (GLUCOPHAGE) 1000 MG tablet TAKE 1 TABLET BY MOUTH TWO  TIMES DAILY 180 tablet 3  . metoprolol succinate (TOPROL-XL) 100 MG 24 hr tablet TAKE 1 TABLET BY MOUTH  DAILY BUT IF BP IS GREATER  THAN 140/90 THEN TAKE 2  TABLETS BY MOUTH DAILY 180 tablet 0  . montelukast (SINGULAIR) 10 MG tablet Take 10 mg by mouth at bedtime as needed.     . Multiple Vitamin (MULTIVITAMIN) tablet Take 1 tablet by mouth daily.      . ONE TOUCH ULTRA TEST test strip USE DAILY AS DIRECTED 100 each 4  . ONETOUCH DELICA LANCETS 99991111 MISC Use daily to check sugar as needed.  Dx E11.9 100 each 3  . spironolactone (ALDACTONE) 25 MG tablet Take 25 mg by mouth once.     . valsartan (DIOVAN) 320 MG tablet Take 320 mg by  mouth daily.     Current Facility-Administered Medications  Medication Dose Route Frequency Provider Last Rate Last Admin  . 0.9 %  sodium chloride infusion  500 mL Intravenous Once Gatha Mayer, MD        Allergies Clonidine derivatives  Family History: Family History  Problem Relation Age of Onset  . Hypertension Mother   . Stroke Mother   . Cancer Father        stomach CA  . Hypertension Father   . Stomach cancer Father   . Hypertension Sister   . Diabetes Sister   . Hypertension Brother   . Prostate cancer Neg Hx   . Colon cancer Neg Hx     Social History: Social History   Socioeconomic History  . Marital status: Married    Spouse name: [name]  . Number of children: Not on file  . Years of education: Not on file  . Highest  education level: Not on file  Occupational History  . Occupation: Retired    Fish farm manager: [employer]    Comment: Also a Clinical biochemist at [employer]  Tobacco Use  . Smoking status: Former Smoker    Quit date: 02/10/1978    Years since quitting: 41.1  . Smokeless tobacco: Never Used  Substance and Sexual Activity  . Alcohol use: No    Alcohol/week: 0.0 standard drinks  . Drug use: No  . Sexual activity: Not on file  Other Topics Concern  . Not on file  Social History Narrative   Married 1978, Bells children - 3 sons 4 grandkids   Clinical biochemist, at [employer].  Works at [employer] as well   He is retired from [employer]   Former smoker, no alcohol no drug use   Social Determinants of Radio broadcast assistant Strain:   . Difficulty of Paying Living Expenses: Not on file  Food Insecurity:   . Worried About Charity fundraiser in the Last Year: Not on file  . Ran Out of Food in the Last Year: Not on file  Transportation Needs:   . Lack of Transportation (Medical): Not on file  . Lack of Transportation (Non-Medical): Not on file  Physical Activity:   . Days of Exercise per Week: Not on file  . Minutes of Exercise per Session: Not on file  Stress:   . Feeling of Stress : Not on file  Social Connections:   . Frequency of Communication with Friends and Family: Not on file  . Frequency of Social Gatherings with Friends and Family: Not on file  . Attends Religious Services: Not on file  . Active Member of Clubs or Organizations: Not on file  . Attends Archivist Meetings: Not on file  . Marital Status: Not on file  Intimate Partner Violence:   . Fear of Current or Ex-Partner: Not on file  . Emotionally Abused: Not on file  . Physically Abused: Not on file  . Sexually Abused: Not on file    Past Surgical History:  Procedure Laterality Date  . COLONOSCOPY    . ESOPHAGOGASTRODUODENOSCOPY     multiple  . THYROID LOBECTOMY  2005   right    Past Medical  History:  Diagnosis Date  . Allergy   . Arthritis   . Chronic hepatitis C without mention of hepatic coma    treatment at [facility]  . 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 Diabetes mellitus  type II  . Full dentures   . Hypertension   . OSA (obstructive sleep apnea) 09/29/2016  . Sleep apnea    on CPap  . 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 Wears glasses     Electrocardiogram:  04/06/19 SR rate 65 nonspecific ST changes   Assessment and Plan  Abnormal ECG chronic no evidence of MI or structural abnormality on echo/ myovue  AV Disease no change in murmur AV sclerosis /Mild AR by TTE 01/13/18 stable since 2017  HTN: Well controlled.  Continue current medications and low sodium Dash type diet.    DM: Discussed low carb diet.  Target hemoglobin A1c is 6.5 or less.  Continue current medications.  Ortho:  Stable no need for TKR at this time    Jenkins Rouge

## 2019-04-05 ENCOUNTER — Ambulatory Visit: Payer: Medicare Other | Admitting: Cardiovascular Disease

## 2019-04-06 ENCOUNTER — Encounter: Payer: Self-pay | Admitting: Cardiovascular Disease

## 2019-04-06 ENCOUNTER — Ambulatory Visit: Payer: Medicare Other | Admitting: Cardiovascular Disease

## 2019-04-06 ENCOUNTER — Other Ambulatory Visit: Payer: Self-pay

## 2019-04-06 VITALS — BP 134/86 | HR 65 | Ht 75.0 in | Wt 277.0 lb

## 2019-04-06 DIAGNOSIS — I10 Essential (primary) hypertension: Secondary | ICD-10-CM | POA: Diagnosis not present

## 2019-04-06 DIAGNOSIS — R9431 Abnormal electrocardiogram [ECG] [EKG]: Secondary | ICD-10-CM

## 2019-04-06 NOTE — Patient Instructions (Addendum)
Medication Instructions:   *If you need a refill on your cardiac medications before your next appointment, please call your pharmacy*  Lab Work:  If you have labs (blood work) drawn today and your tests are completely normal, you will receive your results only by: . MyChart Message (if you have MyChart) OR . A paper copy in the mail If you have any lab test that is abnormal or we need to change your treatment, we will call you to review the results.  Follow-Up: At [facility], you and your health needs are our priority.  As part of our continuing mission to provide you with exceptional heart care, we have created designated Provider Care Teams.  These Care Teams include your primary Cardiologist (physician) and Advanced Practice Providers (APPs -  Physician Assistants and Nurse Practitioners) who all work together to provide you with the care you need, when you need it.  Your next appointment:   12 month(s)  The format for your next appointment:   In Person  Provider:   You may see Peter Nishan, MD or one of the following Advanced Practice Providers on your designated Care Team:    Lori Gerhardt, NP  Laura Ingold, NP  Jill McDaniel, NP    

## 2019-04-22 ENCOUNTER — Other Ambulatory Visit: Payer: Self-pay

## 2019-04-22 ENCOUNTER — Ambulatory Visit (INDEPENDENT_AMBULATORY_CARE_PROVIDER_SITE_OTHER): Payer: Medicare Other | Admitting: Otolaryngology

## 2019-04-22 ENCOUNTER — Encounter (INDEPENDENT_AMBULATORY_CARE_PROVIDER_SITE_OTHER): Payer: Self-pay | Admitting: Otolaryngology

## 2019-04-22 VITALS — Temp 97.7°F

## 2019-04-22 DIAGNOSIS — R053 Chronic cough: Secondary | ICD-10-CM

## 2019-04-22 DIAGNOSIS — R05 Cough: Secondary | ICD-10-CM

## 2019-04-22 DIAGNOSIS — B1920 Unspecified viral hepatitis C without hepatic coma: Secondary | ICD-10-CM

## 2019-04-22 DIAGNOSIS — D696 Thrombocytopenia, unspecified: Secondary | ICD-10-CM

## 2019-04-22 DIAGNOSIS — J310 Chronic rhinitis: Secondary | ICD-10-CM

## 2019-04-22 DIAGNOSIS — E119 Type 2 diabetes mellitus without complications: Secondary | ICD-10-CM

## 2019-04-22 DIAGNOSIS — K7469 Other cirrhosis of liver: Secondary | ICD-10-CM | POA: Diagnosis not present

## 2019-04-22 NOTE — Progress Notes (Signed)
HPI: Alexander [name] is a 69 y.o. male who presents for evaluation of dry cough and postnasal drainage.  The cough is worse when he drinks cold water.  The cough is generally nonproductive.  He has only been having clear mucus discharge from his nose.  He had previously seen Dr. Ernesto Rutherford for a number of years and was prescribed Delsym previously for his cough as needed.  He has also been prescribed Ceftin for occasional sinus infection.  Past Medical History:  Diagnosis Date  . Allergy   . Arthritis   . Chronic hepatitis C without mention of hepatic coma    treatment at [facility]  . 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 Diabetes mellitus    type II  . Full dentures   . Hypertension   . OSA (obstructive sleep apnea) 09/29/2016  . Sleep apnea    on CPap  . 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 Wears glasses    Past Surgical History:  Procedure Laterality Date  . COLONOSCOPY    . ESOPHAGOGASTRODUODENOSCOPY     multiple  . THYROID LOBECTOMY  2005   right   Social History   Socioeconomic History  . Marital status: Married    Spouse name: [name]  . Number of children: Not on file  . Years of education: Not on file  . Highest education level: Not on file  Occupational History  . 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 Smoking status: Former Smoker    Packs/day: 0.25    Years: 5.00    Pack years: 1.25    Start date: 73    Quit date: 02/10/1978    Years since quitting: 41.2  . Smokeless tobacco: Never Used  Substance and Sexual Activity  . Alcohol use: No    Alcohol/week: 0.0 standard drinks  . Drug use: No  . Sexual activity: Not on file  Other Topics Concern  . 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 Difficulty of Paying Living Expenses:   Food Insecurity:   . Worried About Charity fundraiser in the Last Year:   . Arboriculturist in the Last Year:   Transportation Needs:   . Film/video editor (Medical):   Marland Kitchen Lack of Transportation (Non-Medical):   Physical Activity:   . Days of Exercise per Week:   . Minutes of Exercise per Session:   Stress:   . Feeling of Stress :   Social Connections:   . Frequency of Communication with Friends and Family:   . Frequency of Social Gatherings with Friends and Family:   . Attends Religious Services:   . Active Member of Clubs or Organizations:   . Attends Archivist Meetings:   Marland Kitchen Marital Status:    Family History  Problem Relation Age of Onset  . Hypertension Mother   . Stroke Mother   . Cancer Father        stomach CA  . Hypertension Father   . Stomach cancer Father   . Hypertension Sister   .  Diabetes Sister   . Hypertension Brother   . Prostate cancer Neg Hx   . Colon cancer Neg Hx    Allergies  Allergen Reactions  . Clonidine Derivatives     cramps   Prior to Admission medications   Medication Sig Start Date End Date Taking? Authorizing Provider  amLODipine (NORVASC) 10 MG tablet Take 10 mg by mouth daily. 03/08/19  Yes [provider]  aspirin 81 MG tablet Take 81 mg by mouth daily.   Yes [provider]  azelastine (OPTIVAR) 0.05 % ophthalmic solution  07/08/18  Yes [provider]  fexofenadine (ALLEGRA) 180 MG tablet Take 180 mg by mouth daily as needed.    Yes [provider]  glimepiride (AMARYL) 2 MG tablet TAKE 1 TABLET BY MOUTH  DAILY BEFORE BREAKFAST Patient taking differently: 2 mg daily before breakfast. 2 tablets every morning 01/23/17  Yes Tonia Ghent, MD  glucose blood (ONE TOUCH ULTRA TEST) test strip Use daily as needed for diabetes 04/11/15  Yes Tonia Ghent, MD  hydrochlorothiazide  (HYDRODIURIL) 25 MG tablet Take 25 mg by mouth daily. 01/20/18  Yes [provider]  Insulin Glargine (LANTUS) 100 UNIT/ML Solostar Pen Inject 15 Units into the skin daily. 01/13/19  Yes [provider]  levocetirizine (XYZAL) 5 MG tablet Take 5 mg by mouth at bedtime as needed.  12/13/13  Yes [provider]  metFORMIN (GLUCOPHAGE) 1000 MG tablet TAKE 1 TABLET BY MOUTH TWO  TIMES DAILY 04/06/17  Yes Tonia Ghent, MD  metoprolol succinate (TOPROL-XL) 100 MG 24 hr tablet TAKE 1 TABLET BY MOUTH  DAILY BUT IF BP IS GREATER  THAN 140/90 THEN TAKE 2  TABLETS BY MOUTH DAILY 07/10/17  Yes Tonia Ghent, MD  montelukast (SINGULAIR) 10 MG tablet Take 10 mg by mouth at bedtime as needed.    Yes [provider]  Multiple Vitamin (MULTIVITAMIN) tablet Take 1 tablet by mouth daily.     Yes [provider]  ONE TOUCH ULTRA TEST test strip USE DAILY AS DIRECTED 01/26/17  Yes Tonia Ghent, MD  [facility] DELICA LANCETS 99991111 MISC Use daily to check sugar as needed.  Dx E11.9 09/03/15  Yes Tonia Ghent, MD  spironolactone (ALDACTONE) 25 MG tablet Take 25 mg by mouth once.  10/17/18  Yes [provider]  valsartan (DIOVAN) 320 MG tablet Take 320 mg by mouth daily. 01/20/18  Yes [provider]     Positive ROS: Otherwise negative  All other systems have been reviewed and were otherwise negative with the exception of those mentioned in the HPI and as above.  Physical Exam: Constitutional: Alert, well-appearing, no acute distress Ears: External ears without lesions or tenderness. Ear canals are clear bilaterally with intact, clear TMs.  Nasal: External nose without lesions. Septum relatively midline with moderate rhinitis and clear mucus discharge.  Both middle meatus regions are clear with no active mucopurulent discharge noted.. Oral exam: Lips and gums without lesions. Tongue and palate mucosa without lesions. Posterior oropharynx clear..   Indirect laryngoscopy revealed a clear base of tongue and hypopharynx. Neck: No palpable adenopathy or masses Respiratory: Breathing comfortably  Skin: No facial/neck lesions or rash noted.  Procedures  Assessment: Chronic rhinitis. Chronic cough.  Plan: Prescribed Delsym for the cough and gave him samples.  10 cc every 12 hours as needed cough. Also gave him a prescription for Ceftin 250 twice daily for 10 days as needed any persistent cough beyond 2  weeks or yellow-green discharge from coughing.  Radene Journey, MD

## 2019-07-04 ENCOUNTER — Ambulatory Visit: Payer: Medicare Other | Attending: Internal Medicine

## 2019-07-04 DIAGNOSIS — Z20822 Contact with and (suspected) exposure to covid-19: Secondary | ICD-10-CM

## 2019-07-05 LAB — NOVEL CORONAVIRUS, NAA: SARS-CoV-2, NAA: NOT DETECTED

## 2019-07-05 LAB — SARS-COV-2, NAA 2 DAY TAT

## 2019-08-22 ENCOUNTER — Telehealth: Payer: Self-pay | Admitting: Internal Medicine

## 2019-08-22 DIAGNOSIS — B1920 Unspecified viral hepatitis C without hepatic coma: Secondary | ICD-10-CM

## 2019-08-22 NOTE — Telephone Encounter (Signed)
Patient calling to schedule US 

## 2019-08-23 NOTE — Telephone Encounter (Signed)
Magda Paganini, can you please assist in scheduling RUQ [place]

## 2019-08-23 NOTE — Telephone Encounter (Signed)
Spoke with patient and scheduled RUQ u/s at [facility] for 08/30/19 at 8:30.  Patient is aware to arrive at 8:15am and nothing to eat or drink after midnight.

## 2019-08-30 ENCOUNTER — Ambulatory Visit (HOSPITAL_COMMUNITY)
Admission: RE | Admit: 2019-08-30 | Discharge: 2019-08-30 | Disposition: A | Discharge status: Home / Self Care | Payer: Medicare Other | Source: Ambulatory Visit | Attending: Internal Medicine | Admitting: Internal Medicine

## 2019-08-30 ENCOUNTER — Other Ambulatory Visit: Payer: Self-pay

## 2019-08-30 DIAGNOSIS — K7469 Other cirrhosis of liver: Secondary | ICD-10-CM | POA: Insufficient documentation

## 2019-08-30 DIAGNOSIS — B1920 Unspecified viral hepatitis C without hepatic coma: Secondary | ICD-10-CM | POA: Insufficient documentation

## 2019-10-13 ENCOUNTER — Ambulatory Visit: Payer: Medicare Other | Admitting: Internal Medicine

## 2019-10-13 ENCOUNTER — Encounter: Payer: Self-pay | Admitting: Internal Medicine

## 2019-10-13 VITALS — BP 128/78 | HR 64 | Ht 75.0 in | Wt 275.4 lb

## 2019-10-13 DIAGNOSIS — K7469 Other cirrhosis of liver: Secondary | ICD-10-CM | POA: Diagnosis not present

## 2019-10-13 DIAGNOSIS — B1920 Unspecified viral hepatitis C without hepatic coma: Secondary | ICD-10-CM

## 2019-10-13 NOTE — Patient Instructions (Addendum)
Glad you feel ok and there was good news on the ultrasound.   When you go to Dr. Suzy Bouchard - please ask her to have the following labs done and sent to me:  CBC/DIFF INR CMET AFP  She can use the cirrhosis diagnosis to cover these.  We will plan on an ultrasound again in early 2021.   As for Alexander [name] - she needs iron levels and B12 levels checked regarding her anemia and if iron is low would need a colonoscopy.  I appreciate the opportunity to care for you. Alexander Mayer, MD, Marval Regal

## 2019-10-13 NOTE — Progress Notes (Signed)
Alexander [name] 69 y.o. 02/25/1950 [id number]  Assessment & Plan:   Encounter Diagnosis  Name Primary?  . Compensated HCV cirrhosis ([place]) Yes    Ultrasound again in January 2022 for hepatocellular carcinoma screening. Labs to include CBC CMET INR and alpha-fetoprotein through primary care I have written this down in his after visit summary and asked him to have those done there and copy to me.  Return to clinic in a year most likely. Sooner as needed.  I appreciate the opportunity to care for this patient. CC: Nicola Girt, DO   Subjective:   Chief Complaint: Follow-up of cirrhosis  HPI Alexander [name] is here for follow-up on his compensated cirrhosis from HCV status post eradication, he had an ultrasound in July that showed stable liver disease and no tumors. He feels well. He has not had esophageal varices or signs of decompensation. He is due for labs but he has an upcoming visit with his primary care provider and prefers to get them there. 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 Allergies  Allergen Reactions  . Clonidine Derivatives     cramps   Current Meds  Medication Sig  . amLODipine (NORVASC) 10 MG tablet Take 10 mg by mouth daily.  Marland Kitchen aspirin 81 MG tablet Take 81 mg by mouth daily.  Marland Kitchen azelastine (OPTIVAR) 0.05 % ophthalmic solution   . fexofenadine (ALLEGRA) 180 MG tablet Take 180 mg by mouth daily as needed.   Marland Kitchen glimepiride (AMARYL) 2 MG tablet TAKE 1 TABLET BY MOUTH  DAILY BEFORE BREAKFAST (Patient taking differently: 2 mg daily before breakfast. 2 tablets every morning)  . glucose blood (ONE TOUCH ULTRA TEST) test strip Use daily as needed for diabetes  . hydrochlorothiazide (HYDRODIURIL) 25 MG tablet Take 25 mg by mouth daily.  . Insulin Glargine (LANTUS) 100 UNIT/ML Solostar Pen Inject 15 Units into the skin daily.  Marland Kitchen levocetirizine (XYZAL) 5 MG tablet Take 5 mg by mouth at  bedtime as needed.   . metFORMIN (GLUCOPHAGE) 1000 MG tablet TAKE 1 TABLET BY MOUTH TWO  TIMES DAILY  . metoprolol succinate (TOPROL-XL) 100 MG 24 hr tablet TAKE 1 TABLET BY MOUTH  DAILY BUT IF BP IS GREATER  THAN 140/90 THEN TAKE 2  TABLETS BY MOUTH DAILY  . montelukast (SINGULAIR) 10 MG tablet Take 10 mg by mouth at bedtime as needed.   . Multiple Vitamin (MULTIVITAMIN) tablet Take 1 tablet by mouth daily.    . ONE TOUCH ULTRA TEST test strip USE DAILY AS DIRECTED  . ONETOUCH DELICA LANCETS 00T MISC Use daily to check sugar as needed.  Dx E11.9  . spironolactone (ALDACTONE) 25 MG tablet Take 25 mg by mouth once.   . valsartan (DIOVAN) 320 MG tablet Take 320 mg by mouth daily.   Current Facility-Administered Medications for the 10/13/19 encounter (Office Visit) with Gatha Mayer, MD  Medication  . 0.9 %  sodium chloride infusion   Past Medical History:  Diagnosis Date  . Allergy   . Arthritis   . Chronic hepatitis C without mention of hepatic coma    treatment at [facility]  . 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 Diabetes mellitus    type II  . Full dentures   . Hypertension   . OSA (obstructive sleep apnea) 09/29/2016  . Sleep apnea    on CPap  . 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  Wears glasses    Past Surgical History:  Procedure Laterality Date  . COLONOSCOPY    . ESOPHAGOGASTRODUODENOSCOPY     multiple  . 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   Review of Systems As above Objective:   Physical Exam BP 128/78   Pulse 64   Ht 6\' 3"  (1.905 m)   Wt  275 lb 6.4 oz (124.9 kg)   BMI 34.42 kg/m  Well-developed well-nourished obese black male in no acute distress Lungs clear Normal heart sounds The abdomen is obese soft and nontender without organomegaly or mass

## 2019-11-07 ENCOUNTER — Other Ambulatory Visit: Payer: Medicare Other

## 2019-11-07 DIAGNOSIS — Z20822 Contact with and (suspected) exposure to covid-19: Secondary | ICD-10-CM

## 2019-11-08 LAB — NOVEL CORONAVIRUS, NAA: SARS-CoV-2, NAA: NOT DETECTED

## 2019-11-08 LAB — SARS-COV-2, NAA 2 DAY TAT

## 2020-01-18 ENCOUNTER — Telehealth: Payer: Self-pay

## 2020-01-18 DIAGNOSIS — B1920 Unspecified viral hepatitis C without hepatic coma: Secondary | ICD-10-CM

## 2020-01-18 NOTE — Telephone Encounter (Signed)
Alexander [name] informed that U/S appointment is 02/14/2020 at 8:30AM, arrive at 8:15 AM at [facility]. NPO midnight.

## 2020-01-18 NOTE — Telephone Encounter (Signed)
-----   Message from Jacynda Brunke E [place], [place] sent at 10/13/2019 11:37 AM EDT -----  Set him up for a limited RUQ U/S in January, dx-cirrhoisis per Dr Mady Gemma

## 2020-01-25 ENCOUNTER — Other Ambulatory Visit: Payer: Self-pay

## 2020-01-25 ENCOUNTER — Encounter: Payer: Self-pay | Admitting: Pulmonary Disease

## 2020-01-25 ENCOUNTER — Ambulatory Visit: Payer: Medicare Other | Admitting: Pulmonary Disease

## 2020-01-25 VITALS — BP 104/60 | HR 63 | Temp 97.9°F | Ht 75.75 in | Wt 263.4 lb

## 2020-01-25 DIAGNOSIS — E669 Obesity, unspecified: Secondary | ICD-10-CM | POA: Diagnosis not present

## 2020-01-25 DIAGNOSIS — G4730 Sleep apnea, unspecified: Secondary | ICD-10-CM

## 2020-01-25 DIAGNOSIS — G4733 Obstructive sleep apnea (adult) (pediatric): Secondary | ICD-10-CM

## 2020-01-25 DIAGNOSIS — Z9989 Dependence on other enabling machines and devices: Secondary | ICD-10-CM

## 2020-01-25 NOTE — Patient Instructions (Signed)
Follow-up in one year.

## 2020-01-25 NOTE — Progress Notes (Signed)
[facility]  Chief Complaint  Patient presents with   Follow-up    F/u CPAP-air leaks last night,wears 6-7 hrs. Each night, dry mouth    Constitutional:  BP 104/60 (BP Location: Left Arm, Cuff Size: Large)    Pulse 63    Temp 97.9 F (36.6 C) (Temporal)    Ht 6' 3.75" (1.924 m)    Wt 263 lb 6.4 oz (119.5 kg)    SpO2 97%    BMI 32.27 kg/m   Past Medical History:  HTN, DM, Cirrhosis, Hep C, Allergies  Past Surgical History:  His  has a past surgical history that includes Thyroid lobectomy (2005); Esophagogastroduodenoscopy; and Colonoscopy.  Brief Summary:  Alexander [name] is a 69 y.o. male with obstructive sleep apnea.      Subjective:   He grows a beard in November.  Plans to shave after his birthday.  Has more air leak and dry mouth when he has a beard.  He also notices his mask shifting up on his face.  He has medium full face mask.  He had flare of gout last year.  He changed his diet and has lost 16 lbs since last year.  Physical Exam:   Appearance - well kempt   ENMT - no sinus tenderness, no oral exudate, no LAN, Mallampati 2 airway, no stridor, high arched palate  Respiratory - equal breath sounds bilaterally, no wheezing or rales  CV - s1s2 regular rate and rhythm, no murmurs  Ext - no clubbing, no edema  Skin - no rashes  Psych - normal mood and affect   Sleep Tests:   PSG 06/16/16 >> AHI 12, SpO2 low 79%  Auto CPAP 12/24/19 to 01/22/20 >> used on 22 of 30 nights with average 6 hrs 46 min.  Average AHI 1 with median CPAP 12 and 95 th percentile CPAP 14 cm H2O.  Air leak.  Cardiac Tests:   Echo 01/13/18 >> EF 60 to 65%, grade 1, mild AR  Social History:  He  reports that he quit smoking about 41 years ago. He started smoking about 46 years ago. He has a 1.25 pack-year smoking history. He has never used smokeless tobacco. He reports that he does not drink alcohol and does not use drugs.  Family History:   His family history includes Cancer in his father; Diabetes in his sister; Hypertension in his brother, father, mother, and sister; Stomach cancer in his father; Stroke in his mother.     Assessment/Plan:   Obstructive sleep apnea. - he is compliant with CPAP and reports benefit from therapy - he uses [facility] for his DME - continue auto CPAP 5 to 15 cm H2O - advised him to d/w his DME about getting a large size mask  Obesity. - encouraged him to keep up with his diet modification and weight loss efforts  Allergic rhinitis and conjunctivitis. - he is followed by Dr. Mosetta Anis  Hep C with cirrhosis. - followed by Dr. Silvano Rusk with GI  Time Spent Involved in Patient Care on Day of Examination:  22 minutes  Follow up:  Patient Instructions  Follow up in one year   Medication List:   Allergies as of 01/25/2020      Reactions   Clonidine Derivatives    cramps      Medication List       Accurate as of January 25, 2020  9:38 AM. If you have any  questions, ask your nurse or doctor.        amLODipine 10 MG tablet Commonly known as: NORVASC Take 10 mg by mouth daily.   aspirin 81 MG tablet Take 81 mg by mouth daily.   azelastine 0.05 % ophthalmic solution Commonly known as: OPTIVAR   fexofenadine 180 MG tablet Commonly known as: ALLEGRA Take 180 mg by mouth daily as needed.   glimepiride 2 MG tablet Commonly known as: AMARYL TAKE 1 TABLET BY MOUTH  DAILY BEFORE BREAKFAST What changed: See the new instructions.   glucose blood test strip Commonly known as: ONE TOUCH ULTRA TEST Use daily as needed for diabetes   ONE TOUCH ULTRA TEST test strip Generic drug: glucose blood USE DAILY AS DIRECTED   hydrochlorothiazide 25 MG tablet Commonly known as: HYDRODIURIL Take 25 mg by mouth daily.   insulin glargine 100 UNIT/ML Solostar Pen Commonly known as: LANTUS Inject 15 Units into the skin daily.   levocetirizine 5 MG  tablet Commonly known as: XYZAL Take 5 mg by mouth at bedtime as needed.   metFORMIN 1000 MG tablet Commonly known as: GLUCOPHAGE TAKE 1 TABLET BY MOUTH TWO  TIMES DAILY   metoprolol succinate 100 MG 24 hr tablet Commonly known as: TOPROL-XL TAKE 1 TABLET BY MOUTH  DAILY BUT IF BP IS GREATER  THAN 140/90 THEN TAKE 2  TABLETS BY MOUTH DAILY   montelukast 10 MG tablet Commonly known as: SINGULAIR Take 10 mg by mouth at bedtime as needed.   multivitamin tablet Take 1 tablet by mouth daily.   OneTouch Delica Lancets 57Q Misc Use daily to check sugar as needed.  Dx E11.9   spironolactone 25 MG tablet Commonly known as: ALDACTONE Take 25 mg by mouth once.   valsartan 320 MG tablet Commonly known as: DIOVAN Take 320 mg by mouth daily.       Signature:  Chesley Mires, MD Sebastian Pager - [phone] 01/25/2020, 9:38 AM

## 2020-01-27 ENCOUNTER — Other Ambulatory Visit: Payer: Self-pay | Admitting: Physical Medicine and Rehabilitation

## 2020-01-27 DIAGNOSIS — R269 Unspecified abnormalities of gait and mobility: Secondary | ICD-10-CM

## 2020-01-27 DIAGNOSIS — M23306 Other meniscus derangements, unspecified meniscus, right knee: Secondary | ICD-10-CM

## 2020-01-27 DIAGNOSIS — M25461 Effusion, right knee: Secondary | ICD-10-CM

## 2020-01-30 ENCOUNTER — Other Ambulatory Visit: Payer: Medicare Other

## 2020-01-30 DIAGNOSIS — Z20822 Contact with and (suspected) exposure to covid-19: Secondary | ICD-10-CM

## 2020-02-01 LAB — SARS-COV-2, NAA 2 DAY TAT

## 2020-02-01 LAB — NOVEL CORONAVIRUS, NAA: SARS-CoV-2, NAA: NOT DETECTED

## 2020-02-14 ENCOUNTER — Other Ambulatory Visit: Payer: Self-pay

## 2020-02-14 ENCOUNTER — Ambulatory Visit (HOSPITAL_COMMUNITY)
Admission: RE | Admit: 2020-02-14 | Discharge: 2020-02-14 | Disposition: A | Discharge status: Home / Self Care | Payer: Medicare Other | Source: Ambulatory Visit | Attending: Internal Medicine | Admitting: Internal Medicine

## 2020-02-14 DIAGNOSIS — B1920 Unspecified viral hepatitis C without hepatic coma: Secondary | ICD-10-CM

## 2020-02-14 DIAGNOSIS — K7469 Other cirrhosis of liver: Secondary | ICD-10-CM | POA: Diagnosis not present

## 2020-02-16 ENCOUNTER — Other Ambulatory Visit: Payer: Self-pay

## 2020-02-16 ENCOUNTER — Ambulatory Visit
Admission: RE | Admit: 2020-02-16 | Discharge: 2020-02-16 | Disposition: A | Discharge status: Home / Self Care | Payer: Medicare Other | Source: Ambulatory Visit | Attending: Physical Medicine and Rehabilitation | Admitting: Physical Medicine and Rehabilitation

## 2020-02-16 DIAGNOSIS — M25461 Effusion, right knee: Secondary | ICD-10-CM

## 2020-02-16 DIAGNOSIS — R269 Unspecified abnormalities of gait and mobility: Secondary | ICD-10-CM

## 2020-02-16 DIAGNOSIS — M23306 Other meniscus derangements, unspecified meniscus, right knee: Secondary | ICD-10-CM

## 2020-04-03 NOTE — Progress Notes (Signed)
Patient ID: Alexander [name], male   DOB: 06-07-1950, 70 y.o.   MRN: [id number]   69 y.o. initially seen for abnormal ECG 12/2013. Chronic LAD inferolateral T wave changes Previous smoker with HTN, DM And HLD. Previous hepatitis C Quit smoking over 40 years ago. 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 [employer] Wife sees Dr Tamala Julian Activity limited by left knee pain   F/U Myovue normal 12/28/13  to my reading diaphragmatic attenuation Most recent echo 01/13/18 reviewed EF 65-70% mild LVH AV sclerosis with mild AR  Not going to [employer] has hurt him and he is back on insulin with A1c in mid 7 range  Gout last year and went on diet lost 15 lbs or so   Has been vaccinated   No cardiac symptoms   ROS: Denies fever, malais, weight loss, blurry vision, decreased visual acuity, cough, sputum, SOB, hemoptysis, pleuritic pain, palpitaitons, heartburn, abdominal pain, melena, lower extremity edema, claudication, or rash.  All other systems reviewed and negative   General: BP 122/76   Pulse 60   Ht 6\' 3"  (1.905 m)   Wt 124.3 kg   BMI 34.25 kg/m  Affect appropriate Healthy:  appears stated age 77: normal Neck supple with no adenopathy JVP normal no bruits no thyromegaly Lungs clear with no wheezing and good diaphragmatic motion Heart:  S1/S2 SEM murmur, no rub, gallop or click PMI normal Abdomen: benighn, BS positve, no tenderness, no AAA no bruit.  No HSM or HJR Distal pulses intact with no bruits No edema Neuro non-focal Skin warm and dry No muscular weakness  Skin warm and dry No muscular weakness  Medications Current Outpatient Medications  Medication Sig Dispense Refill  . amLODipine (NORVASC) 10 MG tablet Take 10 mg by mouth daily.    Marland Kitchen aspirin 81 MG tablet Take 81 mg by mouth daily.    Marland Kitchen azelastine (OPTIVAR) 0.05 % ophthalmic solution     . fexofenadine (ALLEGRA) 180 MG tablet Take 180 mg by mouth daily as  needed.     Marland Kitchen glimepiride (AMARYL) 2 MG tablet TAKE 1 TABLET BY MOUTH  DAILY BEFORE BREAKFAST (Patient taking differently: 2 mg daily before breakfast. 2 tablets every morning) 90 tablet 2  . glucose blood (ONE TOUCH ULTRA TEST) test strip Use daily as needed for diabetes 100 each 1  . hydrochlorothiazide (HYDRODIURIL) 25 MG tablet Take 25 mg by mouth daily.    . Insulin Glargine (LANTUS) 100 UNIT/ML Solostar Pen Inject 15 Units into the skin daily.    Marland Kitchen levocetirizine (XYZAL) 5 MG tablet Take 5 mg by mouth at bedtime as needed.   0  . metFORMIN (GLUCOPHAGE) 1000 MG tablet TAKE 1 TABLET BY MOUTH TWO  TIMES DAILY 180 tablet 3  . metoprolol succinate (TOPROL-XL) 100 MG 24 hr tablet TAKE 1 TABLET BY MOUTH  DAILY BUT IF BP IS GREATER  THAN 140/90 THEN TAKE 2  TABLETS BY MOUTH DAILY 180 tablet 0  . montelukast (SINGULAIR) 10 MG tablet Take 10 mg by mouth at bedtime as needed.     . Multiple Vitamin (MULTIVITAMIN) tablet Take 1 tablet by mouth daily.    . ONE TOUCH ULTRA TEST test strip USE DAILY AS DIRECTED 100 each 4  . ONETOUCH DELICA LANCETS 80D MISC Use daily to check sugar as needed.  Dx E11.9 100 each 3  . spironolactone (ALDACTONE) 25 MG tablet Take 25 mg  by mouth once.     . valsartan (DIOVAN) 320 MG tablet Take 320 mg by mouth daily.     Current Facility-Administered Medications  Medication Dose Route Frequency Provider Last Rate Last Admin  . 0.9 %  sodium chloride infusion  500 mL Intravenous Once Gatha Mayer, MD        Allergies Clonidine derivatives  Family History: Family History  Problem Relation Age of Onset  . Hypertension Mother   . Stroke Mother   . Cancer Father        stomach CA  . Hypertension Father   . Stomach cancer Father   . Hypertension Sister   . Diabetes Sister   . Hypertension Brother   . Prostate cancer Neg Hx   . Colon cancer Neg Hx     Social History: Social History   Socioeconomic History  . Marital status: Married    Spouse name: [name]   . Number of children: Not on file  . Years of education: Not on file  . Highest education level: Not on file  Occupational History  . Occupation: Retired    Fish farm manager: [employer]    Comment: Also a Clinical biochemist at [employer]  Tobacco Use  . Smoking status: Former Smoker    Packs/day: 0.25    Years: 5.00    Pack years: 1.25    Start date: 76    Quit date: 02/10/1978    Years since quitting: 42.1  . Smokeless tobacco: Never Used  Vaping Use  . Vaping Use: Never used  Substance and Sexual Activity  . Alcohol use: No    Alcohol/week: 0.0 standard drinks  . Drug use: No  . Sexual activity: Not on file  Other Topics Concern  . 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 COLONOSCOPY    . ESOPHAGOGASTRODUODENOSCOPY     multiple  . THYROID LOBECTOMY  2005   right    Past Medical History:  Diagnosis Date  . Allergy   . Arthritis   . Chronic hepatitis C without mention of hepatic coma    treatment at [facility]  . 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 Diabetes mellitus    type II  . Full dentures   . Hypertension   . OSA (obstructive sleep apnea) 09/29/2016  . Sleep apnea    on CPap  . 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 Wears glasses     Electrocardiogram:  04/06/19 SR rate 65 nonspecific ST changes 04/09/2020 SR rate 60 nonspecific ST changes   Assessment and Plan  Abnormal ECG chronic no evidence of MI or structural abnormality on echo/ myovue  AV  Disease no change in murmur AV sclerosis /Mild AR by TTE 01/13/18 stable since 2017  HTN: Well controlled.  Continue current medications and low sodium Dash type diet.    DM: Discussed low carb  diet.  Target hemoglobin A1c is 6.5 or less.  Continue current medications.  Ortho:  Stable no need for TKR at this time   OSA:  F/u Dr Halford Chessman more dry mouth and air leak when he has a beard   Hepatitis C:  F/u Dr Carlean Purl compensated post eradication [place] July 2021 stable     F/U in a year    Jenkins Rouge

## 2020-04-09 ENCOUNTER — Other Ambulatory Visit: Payer: Self-pay

## 2020-04-09 ENCOUNTER — Encounter: Payer: Self-pay | Admitting: Cardiovascular Disease

## 2020-04-09 ENCOUNTER — Ambulatory Visit: Payer: Medicare Other | Admitting: Cardiovascular Disease

## 2020-04-09 VITALS — BP 122/76 | HR 60 | Ht 75.0 in | Wt 274.0 lb

## 2020-04-09 DIAGNOSIS — R011 Cardiac murmur, unspecified: Secondary | ICD-10-CM

## 2020-04-09 DIAGNOSIS — R9431 Abnormal electrocardiogram [ECG] [EKG]: Secondary | ICD-10-CM | POA: Diagnosis not present

## 2020-04-09 NOTE — Patient Instructions (Signed)

## 2020-08-20 ENCOUNTER — Telehealth: Payer: Self-pay

## 2020-08-20 DIAGNOSIS — K7469 Other cirrhosis of liver: Secondary | ICD-10-CM

## 2020-08-20 NOTE — Telephone Encounter (Signed)
Patient aware that he will be contacted by Central scheduling to arrange RUQ [place]

## 2020-08-20 NOTE — Telephone Encounter (Signed)
-----   Message from Marlon Pel, RN sent at 02/20/2020 10:17 AM EST ----- Meeds [place] 6 months see results 02/17/20- Carlean Purl

## 2020-08-27 ENCOUNTER — Other Ambulatory Visit: Payer: Self-pay

## 2020-08-27 ENCOUNTER — Ambulatory Visit (HOSPITAL_COMMUNITY)
Admission: RE | Admit: 2020-08-27 | Discharge: 2020-08-27 | Disposition: A | Discharge status: Home / Self Care | Payer: Medicare Other | Source: Ambulatory Visit | Attending: Internal Medicine | Admitting: Internal Medicine

## 2020-08-27 DIAGNOSIS — K7469 Other cirrhosis of liver: Secondary | ICD-10-CM | POA: Diagnosis present

## 2020-11-13 ENCOUNTER — Encounter: Payer: Self-pay | Admitting: Internal Medicine

## 2020-11-13 ENCOUNTER — Ambulatory Visit: Payer: Medicare Other | Admitting: Internal Medicine

## 2020-11-13 VITALS — BP 100/70 | HR 60 | Ht 73.5 in | Wt 277.1 lb

## 2020-11-13 DIAGNOSIS — K7469 Other cirrhosis of liver: Secondary | ICD-10-CM | POA: Diagnosis not present

## 2020-11-13 DIAGNOSIS — B182 Chronic viral hepatitis C: Secondary | ICD-10-CM

## 2020-11-13 DIAGNOSIS — E8881 Metabolic syndrome: Secondary | ICD-10-CM

## 2020-11-13 DIAGNOSIS — E669 Obesity, unspecified: Secondary | ICD-10-CM

## 2020-11-13 NOTE — Progress Notes (Signed)
Alexander [name] 70 y.o. [birth year] [id number]  Assessment & Plan:   Encounter Diagnoses  Name Primary?   Chronic hepatitis C without hepatic coma (HCC) Yes   Cirrhosis of liver ([employer]) ?    Abdominal obesity and metabolic syndrome    He may have remodeled and not have cirrhosis.  Evaluate with elastography.  Orders Placed This Encounter  Procedures   US ABDOMEN COMPLETE W/ELASTOGRAPHY     Macronutrient handout with low-carb eating ideas and limiting/eliminating ultra processed foods provided to the patient.    Subjective:   Chief Complaint: Follow-up of cirrhosis?  And hepatitis C (eradicated)  HPI Alexander [name] is here for follow-up, he has a history of compensated HCV cirrhosis though platelets have been normal and imaging most recently on ultrasound does not show a cirrhotic liver. EGD 2020 with gastritis no varices neg H pylori  he has no complaints today and reports that he is back exercising at the gym and getting better glucose control with that.  Allergies  Allergen Reactions   Clonidine Derivatives     cramps   Current Meds  Medication Sig   amLODipine (NORVASC) 10 MG tablet Take 10 mg by mouth daily.   aspirin 81 MG tablet Take 81 mg by mouth daily.   azelastine (OPTIVAR) 0.05 % ophthalmic solution Place 2 drops into both eyes as needed.   b complex vitamins capsule Take 1 capsule by mouth daily.   Cholecalciferol 125 MCG (5000 UT) capsule Take 1 capsule by mouth daily.   fexofenadine (ALLEGRA) 180 MG tablet Take 180 mg by mouth daily as needed.    glimepiride (AMARYL) 2 MG tablet TAKE 1 TABLET BY MOUTH  DAILY BEFORE BREAKFAST (Patient taking differently: 2 mg daily before breakfast. 2 tablets every morning)   glucose blood (ONE TOUCH ULTRA TEST) test strip Use daily as needed for diabetes   hydrochlorothiazide (HYDRODIURIL) 25 MG tablet Take 25 mg by mouth daily.   Insulin Glargine (LANTUS) 100 UNIT/ML Solostar Pen Inject 15 Units into the skin daily.    levocetirizine (XYZAL) 5 MG tablet Take 5 mg by mouth at bedtime as needed.    magnesium oxide (MAG-OX) 400 MG tablet Take 1 tablet by mouth daily.   MegaRed Omega-3 Krill Oil 500 MG CAPS Take 1 capsule by mouth daily.   metFORMIN (GLUCOPHAGE-XR) 500 MG 24 hr tablet Take 1,000 mg by mouth 2 (two) times daily.   metoprolol succinate (TOPROL-XL) 100 MG 24 hr tablet TAKE 1 TABLET BY MOUTH  DAILY BUT IF BP IS GREATER  THAN 140/90 THEN TAKE 2  TABLETS BY MOUTH DAILY   montelukast (SINGULAIR) 10 MG tablet Take 10 mg by mouth at bedtime as needed.    Multiple Vitamin (MULTIVITAMIN) tablet Take 1 tablet by mouth daily.   ONE TOUCH ULTRA TEST test strip USE DAILY AS DIRECTED   ONETOUCH DELICA LANCETS 29H MISC Use daily to check sugar as needed.  Dx E11.9   spironolactone (ALDACTONE) 25 MG tablet Take 25 mg by mouth once.    valsartan (DIOVAN) 320 MG tablet Take 320 mg by mouth daily.   Past Medical History:  Diagnosis Date   Allergy    Arthritis    Chronic hepatitis C without mention of hepatic coma    treatment at [facility] 2014   Cirrhosis of liver without mention of alcohol    Diabetes mellitus    type II   Full dentures    Hypertension    OSA (obstructive sleep apnea) 09/29/2016  Sleep apnea    on CPap   Tuberculin test reaction    Wears glasses    Past Surgical History:  Procedure Laterality Date   COLONOSCOPY     ESOPHAGOGASTRODUODENOSCOPY     multiple   THYROID LOBECTOMY  2005   right   Social History   Social History Narrative   Married 1978, Alexander [name]   Grown children - 3 sons 4 grandkids   Clinical biochemist, 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   Review of Systems As above  Objective:   Physical Exam BP 100/70 (BP  Location: Left Arm, Patient Position: Sitting, Cuff Size: Normal)   Pulse 60   Ht 6' 1.5" (1.867 m) Comment: height measured without shoes  Wt 277 lb 2 oz (125.7 kg)   BMI 36.07 kg/m  WWN NAD Lungs cta Cor NL S1S2 no rmg Abd obese, soft and NT Alert and oriented x 3

## 2020-11-13 NOTE — Patient Instructions (Addendum)
You will be contacted by [facility] in the next 2 days to arrange a complete abdomen w/elastography.  The number on your caller ID will be [phone], please answer when they call.  If you have not heard from them in 2 days please call [phone] to schedule.     If you are age 71 or older, your body mass index should be between 23-30. Your Body mass index is 36.07 kg/m. If this is out of the aforementioned range listed, please consider follow up with your Primary Care Provider.  If you are age 62 or younger, your body mass index should be between 19-25. Your Body mass index is 36.07 kg/m. If this is out of the aformentioned range listed, please consider follow up with your Primary Care Provider.   __________________________________________________________  The [facility] providers would like to encourage you to use [facility] to communicate with providers for non-urgent requests or questions.  Due to long hold times on the telephone, sending your provider a message by [facility] may be a faster and more efficient way to get a response.  Please allow 48 business hours for a response.  Please remember that this is for non-urgent requests.    I appreciate the opportunity to care for you. Silvano Rusk, MD, [facility]

## 2020-11-19 ENCOUNTER — Other Ambulatory Visit: Payer: Self-pay

## 2020-11-19 ENCOUNTER — Ambulatory Visit (HOSPITAL_COMMUNITY)
Admission: RE | Admit: 2020-11-19 | Discharge: 2020-11-19 | Disposition: A | Discharge status: Home / Self Care | Payer: Medicare Other | Source: Ambulatory Visit | Attending: Internal Medicine | Admitting: Internal Medicine

## 2020-11-19 DIAGNOSIS — B182 Chronic viral hepatitis C: Secondary | ICD-10-CM | POA: Diagnosis present

## 2020-11-19 DIAGNOSIS — K7469 Other cirrhosis of liver: Secondary | ICD-10-CM | POA: Insufficient documentation

## 2021-01-15 ENCOUNTER — Ambulatory Visit: Payer: Medicare Other | Admitting: Pulmonary Disease

## 2021-03-12 ENCOUNTER — Other Ambulatory Visit: Payer: Self-pay

## 2021-03-12 ENCOUNTER — Ambulatory Visit: Payer: Medicare Other | Admitting: Pulmonary Disease

## 2021-03-12 ENCOUNTER — Encounter: Payer: Self-pay | Admitting: Pulmonary Disease

## 2021-03-12 VITALS — BP 128/70 | HR 65 | Temp 98.0°F | Ht 75.5 in | Wt 272.6 lb

## 2021-03-12 DIAGNOSIS — J310 Chronic rhinitis: Secondary | ICD-10-CM

## 2021-03-12 DIAGNOSIS — G4730 Sleep apnea, unspecified: Secondary | ICD-10-CM

## 2021-03-12 DIAGNOSIS — G4733 Obstructive sleep apnea (adult) (pediatric): Secondary | ICD-10-CM | POA: Diagnosis not present

## 2021-03-12 DIAGNOSIS — E669 Obesity, unspecified: Secondary | ICD-10-CM | POA: Diagnosis not present

## 2021-03-12 NOTE — Progress Notes (Signed)
[facility]  Chief Complaint  Patient presents with   Follow-up    Cpap compliance    Constitutional:  BP 128/70 (BP Location: Left Arm, Cuff Size: Normal)    Pulse 65    Temp 98 F (36.7 C) (Oral)    Ht 6' 3.5" (1.918 m)    Wt 272 lb 9.6 oz (123.7 kg)    SpO2 99%    BMI 33.62 kg/m   Past Medical History:  HTN, DM, Cirrhosis, Hep C, Allergies  Past Surgical History:  His  has a past surgical history that includes Thyroid lobectomy (2005); Esophagogastroduodenoscopy; and Colonoscopy.  Brief Summary:  Alexander [name] is a 71 y.o. male with obstructive sleep apnea.      Subjective:   Uses CPAP nightly.  Has hybrid mask.  Has been using soclean device.  Gets sinus congestion and post nasal drip.  Uses a Navage.  Asked about Inspire device.  Physical Exam:   Appearance - well kempt   ENMT - no sinus tenderness, no oral exudate, no LAN, Mallampati 3 airway, no stridor  Respiratory - equal breath sounds bilaterally, no wheezing or rales  CV - s1s2 regular rate and rhythm, no murmurs  Ext - no clubbing, no edema  Skin - no rashes  Psych - normal mood and affect    Sleep Tests:  PSG 06/16/16 >> AHI 12, SpO2 low 79% Auto CPAP 02/09/21 to 03/10/21 >> used on 25 of 30 nights with average 6 hrs 57 min.  Average AHI 1.5 with median CPAP 13 and 95 th percentile CPAP 14 cm H2O  Cardiac Tests:  Echo 01/13/18 >> EF 60 to 65%, grade 1, mild AR  Social History:  He  reports that he quit smoking about 43 years ago. His smoking use included cigarettes. He started smoking about 48 years ago. He has a 1.25 pack-year smoking history. He has never used smokeless tobacco. He reports that he does not drink alcohol and does not use drugs.  Family History:  His family history includes Cancer in his father; Diabetes in his sister; Hypertension in his brother, father, mother, and sister; Stomach cancer in his father; Stroke in his mother.      Assessment/Plan:   Obstructive sleep apnea. - he is compliant with CPAP and reports benefit from therapy - he uses [facility] for his DME - his device is more than 71 yrs old - will arrange for new Resmed auto CPAP 5 to 15 cm H2O - discussed concerns about ozone based CPAP cleaners - reviewed sets needed for getting an Inspire device; he would like to defer this for now  Obesity. - encouraged him to keep up with his diet modification and weight loss efforts   Allergic rhinitis and conjunctivitis. - he is followed by Dr. Mosetta Anis   Hep C with cirrhosis. - followed by Dr. Silvano Rusk with GI  Time Spent Involved in Patient Care on Day of Examination:  27 minutes  Follow up:   Patient Instructions  Will arrange for new Resmed auto CPAP machine  Follow up in 5 months  Medication List:   Allergies as of 03/12/2021       Reactions   Clonidine Derivatives    cramps        Medication List        Accurate as of March 12, 2021 10:16 AM. If you have any questions, ask your nurse or doctor.  amLODipine 10 MG tablet Commonly known as: NORVASC Take 10 mg by mouth daily.   aspirin 81 MG tablet Take 81 mg by mouth daily.   azelastine 0.05 % ophthalmic solution Commonly known as: OPTIVAR Place 2 drops into both eyes as needed.   b complex vitamins capsule Take 1 capsule by mouth daily.   Cholecalciferol 125 MCG (5000 UT) capsule Take 1 capsule by mouth daily.   fexofenadine 180 MG tablet Commonly known as: ALLEGRA Take 180 mg by mouth daily as needed.   glimepiride 2 MG tablet Commonly known as: AMARYL TAKE 1 TABLET BY MOUTH  DAILY BEFORE BREAKFAST What changed:  how to take this additional instructions   glucose blood test strip Commonly known as: ONE TOUCH ULTRA TEST Use daily as needed for diabetes   ONE TOUCH ULTRA TEST test strip Generic drug: glucose blood USE DAILY AS DIRECTED   hydrochlorothiazide 25 MG  tablet Commonly known as: HYDRODIURIL Take 25 mg by mouth daily.   insulin glargine 100 UNIT/ML Solostar Pen Commonly known as: LANTUS Inject 15 Units into the skin daily.   levocetirizine 5 MG tablet Commonly known as: XYZAL Take 5 mg by mouth at bedtime as needed.   magnesium oxide 400 MG tablet Commonly known as: MAG-OX Take 1 tablet by mouth daily.   MegaRed Omega-3 Krill Oil 500 MG Caps Take 1 capsule by mouth daily.   metFORMIN 500 MG 24 hr tablet Commonly known as: GLUCOPHAGE-XR Take 1,000 mg by mouth 2 (two) times daily.   metoprolol succinate 100 MG 24 hr tablet Commonly known as: TOPROL-XL TAKE 1 TABLET BY MOUTH  DAILY BUT IF BP IS GREATER  THAN 140/90 THEN TAKE 2  TABLETS BY MOUTH DAILY   montelukast 10 MG tablet Commonly known as: SINGULAIR Take 10 mg by mouth at bedtime as needed.   multivitamin tablet Take 1 tablet by mouth daily.   OneTouch Delica Lancets 93J Misc Use daily to check sugar as needed.  Dx E11.9   spironolactone 25 MG tablet Commonly known as: ALDACTONE Take 25 mg by mouth once.   valsartan 320 MG tablet Commonly known as: DIOVAN Take 320 mg by mouth daily.        Signature:  Chesley Mires, MD [place] Pager - [phone] 03/12/2021, 10:16 AM

## 2021-03-12 NOTE — Patient Instructions (Signed)
Will arrange for new Resmed auto CPAP machine  Follow up in 5 months

## 2021-04-02 ENCOUNTER — Telehealth: Payer: Self-pay | Admitting: Pulmonary Disease

## 2021-04-03 NOTE — Telephone Encounter (Signed)
Called and LVM for patient in regards to CPAP results. Patients new order for CPAP was placed in January however it can take up 4 months to receive new CPAP. Will try again later.

## 2021-04-03 NOTE — Progress Notes (Signed)
Patient ID: Alexander [name], male   DOB: 03-17-1950, 71 y.o.   MRN: [id number]   71 y.o. initially seen for abnormal ECG 12/2013. Chronic LAD inferolateral T wave changes Previous smoker with HTN, DM And HLD. Previous hepatitis C Quit smoking over 40 years ago. 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  Son does sports for [facility] [employer] and is home for vacation   No cardiac symptoms   ROS: Denies fever, malais, weight loss, blurry vision, decreased visual acuity, cough, sputum, SOB, hemoptysis, pleuritic pain, palpitaitons, heartburn, abdominal pain, melena, lower extremity edema, claudication, or rash.  All other systems reviewed and negative   General: There were no vitals taken for this visit. Affect appropriate Overweight black male  HEENT: post thyroid lobectomy  Neck supple with no adenopathy JVP normal no bruits no thyromegaly Lungs clear with no wheezing and good diaphragmatic motion Heart:  S1/S2 SEM murmur, no rub, gallop or click PMI normal Abdomen: benighn, BS positve, no tenderness, no AAA no bruit.  No HSM or HJR Distal pulses intact with no bruits No edema Neuro non-focal Skin warm and dry No muscular weakness  Skin warm and dry No muscular weakness  Medications Current Outpatient Medications  Medication Sig Dispense Refill   amLODipine (NORVASC) 10 MG tablet Take 10 mg by mouth daily.     aspirin 81 MG tablet Take 81 mg by mouth daily.     azelastine (OPTIVAR) 0.05 % ophthalmic solution Place 2 drops into both eyes as needed.     b complex vitamins capsule Take 1 capsule by mouth daily.     Cholecalciferol 125 MCG (5000 UT) capsule Take 1 capsule by mouth  daily.     fexofenadine (ALLEGRA) 180 MG tablet Take 180 mg by mouth daily as needed.      glimepiride (AMARYL) 2 MG tablet TAKE 1 TABLET BY MOUTH  DAILY BEFORE BREAKFAST (Patient taking differently: 2 mg daily before breakfast. 2 tablets every morning) 90 tablet 2   glucose blood (ONE TOUCH ULTRA TEST) test strip Use daily as needed for diabetes 100 each 1   hydrochlorothiazide (HYDRODIURIL) 25 MG tablet Take 25 mg by mouth daily.     Insulin Glargine (LANTUS) 100 UNIT/ML Solostar Pen Inject 15 Units into the skin daily.     levocetirizine (XYZAL) 5 MG tablet Take 5 mg by mouth at bedtime as needed.  (Patient not taking: Reported on 03/12/2021)  0   magnesium oxide (MAG-OX) 400 MG tablet Take 1 tablet by mouth daily.     MegaRed Omega-3 Krill Oil 500 MG CAPS Take 1 capsule by mouth daily.     metFORMIN (GLUCOPHAGE-XR) 500 MG 24 hr tablet Take 1,000 mg by mouth 2 (two) times daily.     metoprolol succinate (TOPROL-XL) 100 MG 24 hr tablet TAKE 1 TABLET BY MOUTH  DAILY BUT IF BP IS GREATER  THAN 140/90 THEN TAKE 2  TABLETS BY MOUTH DAILY 180 tablet 0   montelukast (SINGULAIR) 10 MG tablet Take 10 mg by mouth at bedtime as needed.      Multiple Vitamin (MULTIVITAMIN) tablet Take 1 tablet by mouth daily.     ONE  TOUCH ULTRA TEST test strip USE DAILY AS DIRECTED 100 each 4   ONETOUCH DELICA LANCETS 73X MISC Use daily to check sugar as needed.  Dx E11.9 100 each 3   spironolactone (ALDACTONE) 25 MG tablet Take 25 mg by mouth once.      valsartan (DIOVAN) 320 MG tablet Take 320 mg by mouth daily.     No current facility-administered medications for this visit.    Allergies Clonidine derivatives  Family History: Family History  Problem Relation Age of Onset   Hypertension Mother    Stroke Mother    Cancer Father        stomach CA   Hypertension Father    Stomach cancer Father    Hypertension Sister    Diabetes Sister    Hypertension Brother    Prostate cancer Neg Hx    Colon cancer Neg Hx      Social History: Social History   Socioeconomic History   Marital status: Married    Spouse name: [name]   Number of children: Not on file   Years of education: Not on file   Highest education level: Not on file  Occupational History   Occupation: Retired    Fish farm manager: [employer]    Comment: Also a Clinical biochemist at [employer] Use   Smoking status: Former    Packs/day: 0.25    Years: 5.00    Pack years: 1.25    Types: Cigarettes    Start date: 1975    Quit date: 02/10/1978    Years since quitting: 43.1   Smokeless tobacco: Never  Vaping Use   Vaping Use: Never used  Substance and Sexual Activity   Alcohol use: No    Alcohol/week: 0.0 standard drinks   Drug use: No   Sexual activity: Not on file  Other Topics Concern   Not on file  Social History Narrative   Married 1978, [place] children - 3 sons 4 grandkids   Clinical biochemist, at [employer].  Works at [employer] as well   He is retired from [employer]   Former smoker, no alcohol no drug use   Social Determinants of Radio broadcast assistant Strain: Not on Art therapist Insecurity: Not on file  Transportation Needs: Not on file  Physical Activity: Not on file  Stress: Not on file  Social Connections: Not on file  Intimate Partner Violence: Not on file    Past Surgical History:  Procedure Laterality Date   COLONOSCOPY     ESOPHAGOGASTRODUODENOSCOPY     multiple   THYROID LOBECTOMY  2005   right    Past Medical History:  Diagnosis Date   Allergy    Arthritis    Chronic hepatitis C without mention of hepatic coma    treatment at [place]   Cirrhosis of liver without mention of alcohol    Diabetes mellitus    type II   Full dentures    Hypertension    OSA (obstructive sleep apnea) 09/29/2016   Sleep apnea    on CPap   Tuberculin test reaction    Wears glasses     Electrocardiogram:  SR rate 52 nonspecific ST changes    Assessment and Plan  Abnormal ECG chronic no evidence of MI  or structural abnormality on echo/ myovue  AV Disease no change in murmur AV sclerosis /Mild AR by TTE 01/13/18 will update echo  HTN: Well controlled.  Continue current medications and low  sodium Dash type diet.    DM: Discussed low carb diet.  Target hemoglobin A1c is 6.5 or less.  Continue current medications.  Ortho:  Stable no need for TKR at this time   OSA:  F/u Dr Halford Chessman compliant ? Inspiris device referral   Hepatitis C:  F/u Dr Carlean Purl compensated post eradication [place] 11/19/20 stable elasticity With minimal scarring   Echo for AV dx  F/U in a year    [employer]

## 2021-04-09 ENCOUNTER — Encounter: Payer: Self-pay | Admitting: Cardiovascular Disease

## 2021-04-09 ENCOUNTER — Ambulatory Visit: Payer: Medicare Other | Admitting: Cardiovascular Disease

## 2021-04-09 ENCOUNTER — Other Ambulatory Visit: Payer: Self-pay

## 2021-04-09 VITALS — BP 110/72 | HR 52 | Ht 75.0 in | Wt 271.0 lb

## 2021-04-09 DIAGNOSIS — R011 Cardiac murmur, unspecified: Secondary | ICD-10-CM

## 2021-04-09 DIAGNOSIS — I350 Nonrheumatic aortic (valve) stenosis: Secondary | ICD-10-CM

## 2021-04-09 DIAGNOSIS — I10 Essential (primary) hypertension: Secondary | ICD-10-CM

## 2021-04-09 DIAGNOSIS — R9431 Abnormal electrocardiogram [ECG] [EKG]: Secondary | ICD-10-CM

## 2021-04-09 NOTE — Patient Instructions (Signed)
Medication Instructions:  ?Your physician recommends that you continue on your current medications as directed. Please refer to the Current Medication list given to you today. ? ?*If you need a refill on your cardiac medications before your next appointment, please call your pharmacy* ? ?Lab Work: ?If you have labs (blood work) drawn today and your tests are completely normal, you will receive your results only by: ?MyChart Message (if you have MyChart) OR ?A paper copy in the mail ?If you have any lab test that is abnormal or we need to change your treatment, we will call you to review the results. ? ?Testing/Procedures: ?Your physician has requested that you have an echocardiogram. Echocardiography is a painless test that uses sound waves to create images of your heart. It provides your doctor with information about the size and shape of your heart and how well your heart?s chambers and valves are working. This procedure takes approximately one hour. There are no restrictions for this procedure. ? ?Follow-Up: ?At [facility], you and your health needs are our priority.  As part of our continuing mission to provide you with exceptional heart care, we have created designated Provider Care Teams.  These Care Teams include your primary Cardiologist (physician) and Advanced Practice Providers (APPs -  Physician Assistants and Nurse Practitioners) who all work together to provide you with the care you need, when you need it. ? ?We recommend signing up for the patient portal called "MyChart".  Sign up information is provided on this After Visit Summary.  MyChart is used to connect with patients for Virtual Visits (Telemedicine).  Patients are able to view lab/test results, encounter notes, upcoming appointments, etc.  Non-urgent messages can be sent to your provider as well.   ?To learn more about what you can do with MyChart, go to [email or web address].   ? ?Your next appointment:   ?1 year(s) ? ?The format for  your next appointment:   ?In Person ? ?Provider:   ?Peter Nishan, MD { ? ?

## 2021-04-10 ENCOUNTER — Encounter: Payer: Self-pay | Admitting: Internal Medicine

## 2021-04-16 ENCOUNTER — Ambulatory Visit (HOSPITAL_COMMUNITY): Payer: Medicare Other | Attending: Cardiovascular Disease

## 2021-04-16 ENCOUNTER — Other Ambulatory Visit: Payer: Self-pay

## 2021-04-16 DIAGNOSIS — I351 Nonrheumatic aortic (valve) insufficiency: Secondary | ICD-10-CM | POA: Diagnosis not present

## 2021-04-16 DIAGNOSIS — I350 Nonrheumatic aortic (valve) stenosis: Secondary | ICD-10-CM | POA: Insufficient documentation

## 2021-04-16 LAB — ECHOCARDIOGRAM COMPLETE
AR max vel: 2.16 cm2
AV Area VTI: 2.21 cm2
AV Area mean vel: 2.07 cm2
AV Mean grad: 4.3 mmHg
AV Peak grad: 7.6 mmHg
Ao pk vel: 1.38 m/s
Area-P 1/2: 5.02 cm2
P 1/2 time: 519 ms
S' Lateral: 2.1 cm

## 2021-08-27 ENCOUNTER — Ambulatory Visit: Payer: Medicare Other | Admitting: Pulmonary Disease

## 2021-08-27 ENCOUNTER — Encounter: Payer: Self-pay | Admitting: Pulmonary Disease

## 2021-08-27 ENCOUNTER — Telehealth: Payer: Self-pay | Admitting: *Deleted

## 2021-08-27 VITALS — BP 110/70 | HR 84 | Temp 98.1°F | Ht 75.38 in | Wt 268.8 lb

## 2021-08-27 DIAGNOSIS — G4730 Sleep apnea, unspecified: Secondary | ICD-10-CM

## 2021-08-27 DIAGNOSIS — G4733 Obstructive sleep apnea (adult) (pediatric): Secondary | ICD-10-CM

## 2021-08-27 DIAGNOSIS — E669 Obesity, unspecified: Secondary | ICD-10-CM

## 2021-08-27 NOTE — Telephone Encounter (Signed)
[facility] [employer] x1 to find out the status of the new CPAP machine that was ordered 03/12/2021.  LVM to return call.

## 2021-08-27 NOTE — Progress Notes (Signed)
[facility]  Chief Complaint  Patient presents with   Follow-up    Doing well with CPAP.  Thinks it may be time for a new machine.    Constitutional:  BP 110/70 (BP Location: Right Arm, Patient Position: Sitting, Cuff Size: Large)   Pulse 84   Temp 98.1 F (36.7 C) (Oral)   Ht 6' 3.38" (1.915 m)   Wt 268 lb 12.8 oz (121.9 kg)   SpO2 97%   BMI 33.26 kg/m   Past Medical History:  HTN, DM, Cirrhosis, Hep C, Allergies  Past Surgical History:  His  has a past surgical history that includes Thyroid lobectomy (2005); Esophagogastroduodenoscopy; and Colonoscopy.  Brief Summary:  Alexander [name] is a 71 y.o. male with obstructive sleep apnea.      Subjective:   He hasn't received his new CPAP machine yet.    Using CPAP nightly.  His device isn't registering usage consistently even though he is wearing his CPAP.  No issues with mask fit.  He had questions about Inspire device.  He was recently started on ozempic.  He is hoping this will help him lose weight.  Physical Exam:   Appearance - well kempt   ENMT - no sinus tenderness, no oral exudate, no LAN, Mallampati 3 airway, no stridor  Respiratory - equal breath sounds bilaterally, no wheezing or rales  CV - s1s2 regular rate and rhythm, no murmurs  Ext - no clubbing, no edema  Skin - no rashes  Psych - normal mood and affect     Sleep Tests:  PSG 06/16/16 >> AHI 12, SpO2 low 79% Auto CPAP 07/19/21 to 08/17/21 >> used on 25 of 30 nights with average 6 hrs 53 min.  Average AHI 1.2 with median CPAP 13 and 95 th percentile CPAP 14 cm H2O  Cardiac Tests:  Echo 01/13/18 >> EF 60 to 65%, grade 1, mild AR  Social History:  He  reports that he quit smoking about 43 years ago. His smoking use included cigarettes. He started smoking about 48 years ago. He has a 1.25 pack-year smoking history. He has never used smokeless tobacco. He reports that he does not drink alcohol and  does not use drugs.  Family History:  His family history includes Cancer in his father; Diabetes in his sister; Hypertension in his brother, father, mother, and sister; Stomach cancer in his father; Stroke in his mother.     Assessment/Plan:   Obstructive sleep apnea. - he is compliant with CPAP and reports benefit from therapy - he uses [facility] for his DME - will check on status of his new Resmed auto CPAP machine at 5 to 15 cm H2O - reviewed alternative therapies for sleep apnea  Obesity. - discussed how ozempic can help with weight loss and then might improve sleep apnea control   Allergic rhinitis and conjunctivitis. - he is followed by Dr. Mosetta Anis   Hep C with cirrhosis. - followed by Dr. Silvano Rusk with GI  Time Spent Involved in Patient Care on Day of Examination:  26 minutes  Follow up:   Patient Instructions  Will have our patient care coordinators check on the status of your new auto CPAP machine.  Call the office if you don't hear anything from [place] in the next 2 weeks.  Follow up in 4 months.  Medication List:   Allergies as of 08/27/2021       Reactions   Clonidine Derivatives  cramps        Medication List        Accurate as of August 27, 2021  2:12 PM. If you have any questions, ask your nurse or doctor.          STOP taking these medications    Cholecalciferol 125 MCG (5000 UT) capsule Stopped by: Chesley Mires, MD       TAKE these medications    amLODipine 10 MG tablet Commonly known as: NORVASC Take 10 mg by mouth daily.   aspirin 81 MG tablet Take 81 mg by mouth daily.   azelastine 0.05 % ophthalmic solution Commonly known as: OPTIVAR Place 2 drops into both eyes as needed.   b complex vitamins capsule Take 1 capsule by mouth daily.   cyanocobalamin 1000 MCG/ML injection Commonly known as: (VITAMIN B-12) Inject into the muscle.   fexofenadine 180 MG tablet Commonly known as: ALLEGRA Take 180 mg by mouth  daily as needed.   glimepiride 2 MG tablet Commonly known as: AMARYL TAKE 1 TABLET BY MOUTH  DAILY BEFORE BREAKFAST What changed:  how to take this additional instructions   glucose blood test strip Commonly known as: ONE TOUCH ULTRA TEST Use daily as needed for diabetes   ONE TOUCH ULTRA TEST test strip Generic drug: glucose blood USE DAILY AS DIRECTED   hydrochlorothiazide 25 MG tablet Commonly known as: HYDRODIURIL Take 25 mg by mouth daily.   insulin glargine 100 UNIT/ML Solostar Pen Commonly known as: LANTUS Inject 14 Units into the skin daily.   levocetirizine 5 MG tablet Commonly known as: XYZAL Take 5 mg by mouth at bedtime as needed.   magnesium oxide 400 MG tablet Commonly known as: MAG-OX Take 1 tablet by mouth daily.   MegaRed Omega-3 Krill Oil 500 MG Caps Take 1 capsule by mouth daily.   metFORMIN 500 MG 24 hr tablet Commonly known as: GLUCOPHAGE-XR Take 1,000 mg by mouth 2 (two) times daily.   metoprolol succinate 100 MG 24 hr tablet Commonly known as: TOPROL-XL TAKE 1 TABLET BY MOUTH  DAILY BUT IF BP IS GREATER  THAN 140/90 THEN TAKE 2  TABLETS BY MOUTH DAILY   montelukast 10 MG tablet Commonly known as: SINGULAIR Take 10 mg by mouth at bedtime as needed.   multivitamin tablet Take 1 tablet by mouth daily.   OneTouch Delica Lancets 01B Misc Use daily to check sugar as needed.  Dx E11.9   Ozempic (0.25 or 0.5 MG/DOSE) 2 MG/3ML Sopn Generic drug: Semaglutide(0.25 or 0.'5MG'$ /DOS) Inject into the skin.   spironolactone 25 MG tablet Commonly known as: ALDACTONE Take 25 mg by mouth once.   valsartan 320 MG tablet Commonly known as: DIOVAN Take 320 mg by mouth daily.        Signature:  Chesley Mires, MD [place] Pager - [phone] 08/27/2021, 2:12 PM

## 2021-08-27 NOTE — Patient Instructions (Signed)
Will have our patient care coordinators check on the status of your new auto CPAP machine.  Call the office if you don't hear anything from [place] in the next 2 weeks.  Follow up in 4 months.

## 2021-09-06 ENCOUNTER — Telehealth: Payer: Self-pay | Admitting: Pulmonary Disease

## 2021-09-06 NOTE — Telephone Encounter (Signed)
Called and spoke with patient. He stated that he spoke with [facility] and they have not received his cpap order. I asked him that it looked like Dr. Halford Chessman wanted [place] to follow up on the original order that placed back in January. He is aware that I will see if the PCCs can resend the original order.   PCCs, please advise if you all can resend the original order from January or if a new order will need to be placed. Thanks!

## 2021-09-09 NOTE — Telephone Encounter (Signed)
Called [facility] and spoke to [place].  She states order was received in Jan and when they spoke to pt in Feb they made him aware he would not be eligible for new machine until 7/10.  They will process order now.  I called pt and spoke to his wife who is on DPR and made her aware they will process order for new cpap.  Nothing further is needed.

## 2021-10-01 ENCOUNTER — Telehealth: Payer: Self-pay | Admitting: Pulmonary Disease

## 2021-10-01 DIAGNOSIS — G4733 Obstructive sleep apnea (adult) (pediatric): Secondary | ICD-10-CM

## 2021-10-01 NOTE — Telephone Encounter (Signed)
Called and spoke with pt who states he was told by [facility] that they did not receive an order in July 2023 when he was eligible to receive a new cpap machine. Stated to pt that an order had been placed 02/2021 but not in 08/2021. Stated to pt that I would get the new order placed and he verbalized understanding. Nothing further needed.

## 2022-01-10 ENCOUNTER — Encounter: Payer: Self-pay | Admitting: Internal Medicine

## 2022-01-10 ENCOUNTER — Ambulatory Visit (INDEPENDENT_AMBULATORY_CARE_PROVIDER_SITE_OTHER): Payer: Medicare Other | Admitting: Internal Medicine

## 2022-01-10 VITALS — BP 110/68 | HR 75 | Ht 76.0 in | Wt 268.6 lb

## 2022-01-10 DIAGNOSIS — K760 Fatty (change of) liver, not elsewhere classified: Secondary | ICD-10-CM | POA: Diagnosis not present

## 2022-01-10 DIAGNOSIS — E669 Obesity, unspecified: Secondary | ICD-10-CM | POA: Diagnosis not present

## 2022-01-10 DIAGNOSIS — E8881 Metabolic syndrome: Secondary | ICD-10-CM | POA: Diagnosis not present

## 2022-01-10 DIAGNOSIS — B182 Chronic viral hepatitis C: Secondary | ICD-10-CM

## 2022-01-10 NOTE — Patient Instructions (Signed)
You have been scheduled for an abdominal ultrasound at [facility] (1st floor of hospital) on 01/27/22 at [place]. Please arrive 15 minutes prior to your appointment for registration. Make certain not to have anything to eat or drink 6 hours prior to your appointment. Should you need to reschedule your appointment, please contact radiology at [phone]. This test typically takes about 30 minutes to perform.   I appreciate the opportunity to care for you. Silvano Rusk, MD

## 2022-01-10 NOTE — Progress Notes (Signed)
Alexander [name] 71 y.o. Apr 16, 1950 [id number]  Assessment & Plan:   Encounter Diagnoses  Name Primary?   Chronic hepatitis C without hepatic coma (HCC) Yes   Hepatic steatosis    Abdominal obesity and metabolic syndrome    He has had some steatosis suspected on prior ultrasound and I think he does have some abdominal obesity and metabolic syndrome problems based upon his diabetes etc.  I do not think he has cirrhosis anymore though he remains at risk due to metabolic syndrome issues.  I am going to do 1 more elastography to reassess and if that is again without signs of cirrhosis we will probably stop doing these routinely.    Subjective:   Chief Complaint: History of hepatitis C and cirrhosis  HPI 71 year old African-American man with diabetes, history of hepatitis C (eradicated 2014) who had been followed with compensated cirrhosis and last year it looked like he no longer had cirrhosis through regular ultrasound imaging and elastography.  He continues with normal platelets.  He had a slight anemia and was found to have a low B12 earlier this year and took injections and is now on oral supplementation.  He is now on Ozempic and losing some weight and is off Lantus.  Wt Readings from Last 3 Encounters:  01/10/22 268 lb 9.6 oz (121.8 kg)  08/27/21 268 lb 12.8 oz (121.9 kg)  04/09/21 271 lb (122.9 kg)   Walking at gym 45 mins 2-3x/week + neighborhood walkings  Hemoglobin A1c was 8% in June and 6.6% in October LFTs normal June 2023 did have platelets 140 in June and then 199 in October (after B12 supplementation).  Hemoglobin slightly low at 12.5 and stable.  MCV 80.  Ferritin was 40.  Allergies  Allergen Reactions   Clonidine Derivatives     cramps   Current Meds  Medication Sig   amLODipine (NORVASC) 10 MG tablet Take 10 mg by mouth daily.   aspirin 81 MG tablet Take 81 mg by mouth daily.   azelastine (OPTIVAR) 0.05 % ophthalmic solution Place 2 drops into both eyes as  needed.   b complex vitamins capsule Take 1 capsule by mouth daily.   fexofenadine (ALLEGRA) 180 MG tablet Take 180 mg by mouth daily as needed.    glimepiride (AMARYL) 2 MG tablet TAKE 1 TABLET BY MOUTH  DAILY BEFORE BREAKFAST (Patient taking differently: 2 mg daily before breakfast. 2 tablets every morning)   hydrochlorothiazide (HYDRODIURIL) 25 MG tablet Take 25 mg by mouth daily.   levocetirizine (XYZAL) 5 MG tablet Take 5 mg by mouth at bedtime as needed.   magnesium oxide (MAG-OX) 400 MG tablet Take 1 tablet by mouth daily.   MegaRed Omega-3 Krill Oil 500 MG CAPS Take 1 capsule by mouth daily.   metFORMIN (GLUCOPHAGE-XR) 500 MG 24 hr tablet Take 1,000 mg by mouth 2 (two) times daily.   metoprolol succinate (TOPROL-XL) 100 MG 24 hr tablet TAKE 1 TABLET BY MOUTH  DAILY BUT IF BP IS GREATER  THAN 140/90 THEN TAKE 2  TABLETS BY MOUTH DAILY   montelukast (SINGULAIR) 10 MG tablet Take 10 mg by mouth at bedtime as needed.    Multiple Vitamin (MULTIVITAMIN) tablet Take 1 tablet by mouth daily.   ONE TOUCH ULTRA TEST test strip USE DAILY AS DIRECTED   ONETOUCH DELICA LANCETS 67T MISC Use daily to check sugar as needed.  Dx E11.9   OZEMPIC, 0.25 OR 0.5 MG/DOSE, 2 MG/3ML SOPN Inject into the skin.  spironolactone (ALDACTONE) 25 MG tablet Take 25 mg by mouth once.    valsartan (DIOVAN) 320 MG tablet Take 320 mg by mouth daily.   Past Medical History:  Diagnosis Date   Allergy    Arthritis    Chronic hepatitis C without mention of hepatic coma    treatment at [facility] 2014   Cirrhosis of liver without mention of alcohol    Diabetes mellitus    type II   Full dentures    Hypertension    OSA (obstructive sleep apnea) 09/29/2016   Sleep apnea    on CPap   Tuberculin test reaction    Wears glasses    Past Surgical History:  Procedure Laterality Date   COLONOSCOPY     ESOPHAGOGASTRODUODENOSCOPY     multiple   THYROID LOBECTOMY  2005   right   Social History   Social History Narrative    Married 1978, [name]   Grown children - 3 sons 4 grandkids   Clinical biochemist, 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   Review of Systems As above otherwise negative  Objective:   Physical Exam '@BP'$  110/68   Pulse 75   Ht '6\' 4"'$  (1.93 m)   Wt 268 lb 9.6 oz (121.8 kg)   SpO2 97%   BMI 32.70 kg/m @  General:  NAD Eyes:   anicteric Lungs:  clear Heart::  S1S2 no rubs, murmurs or gallops Abdomen:  soft and nontender, BS+, mod obese, no HSM/mass     Data Reviewed:  See HPI

## 2022-01-27 ENCOUNTER — Ambulatory Visit (HOSPITAL_COMMUNITY)
Admission: RE | Admit: 2022-01-27 | Discharge: 2022-01-27 | Disposition: A | Discharge status: Home / Self Care | Payer: Medicare Other | Source: Ambulatory Visit | Attending: Internal Medicine | Admitting: Internal Medicine

## 2022-01-27 DIAGNOSIS — K760 Fatty (change of) liver, not elsewhere classified: Secondary | ICD-10-CM | POA: Diagnosis present

## 2022-01-27 DIAGNOSIS — B182 Chronic viral hepatitis C: Secondary | ICD-10-CM | POA: Diagnosis present

## 2022-01-30 ENCOUNTER — Telehealth: Payer: Self-pay | Admitting: Internal Medicine

## 2022-01-30 NOTE — Telephone Encounter (Signed)
Refer to 12/18 [place] note.

## 2022-01-30 NOTE — Telephone Encounter (Signed)
Patient calling back in regards to results from his Ultrasound. Please advise.

## 2022-03-20 ENCOUNTER — Ambulatory Visit (INDEPENDENT_AMBULATORY_CARE_PROVIDER_SITE_OTHER): Payer: Medicare Other | Admitting: Pulmonary Disease

## 2022-03-20 ENCOUNTER — Encounter (HOSPITAL_BASED_OUTPATIENT_CLINIC_OR_DEPARTMENT_OTHER): Payer: Self-pay | Admitting: Pulmonary Disease

## 2022-03-20 VITALS — BP 128/62 | HR 70 | Temp 98.3°F | Ht 75.0 in | Wt 266.2 lb

## 2022-03-20 DIAGNOSIS — G4733 Obstructive sleep apnea (adult) (pediatric): Secondary | ICD-10-CM | POA: Diagnosis not present

## 2022-03-20 NOTE — Patient Instructions (Signed)
Follow up in 1 year.

## 2022-03-20 NOTE — Progress Notes (Signed)
[facility]  Chief Complaint  Patient presents with   Follow-up    Cpap compliance    Constitutional:  BP 128/62 (BP Location: Left Arm, Cuff Size: Normal)   Pulse 70   Temp 98.3 F (36.8 C) (Oral)   Ht '6\' 3"'$  (1.905 m)   Wt 266 lb 3.2 oz (120.7 kg)   SpO2 98%   BMI 33.27 kg/m   Past Medical History:  HTN, DM, Cirrhosis, Hep C, Allergies  Past Surgical History:  His  has a past surgical history that includes Thyroid lobectomy (2005); Esophagogastroduodenoscopy; and Colonoscopy.  Brief Summary:  Alexander [name] is a 72 y.o. male with obstructive sleep apnea.      Subjective:   He got his new CPAP machine over the Summer.  Working well.  He has compliance information on his phone.  Using consistently for more than 4 hours per night, and AHI below 5 with no significant air leak.  No issues with mask fit.  He keeps his old machine at his second home.  He asked about the RSV vaccine.  Physical Exam:   Appearance - well kempt   ENMT - no sinus tenderness, no oral exudate, no LAN, Mallampati 3 airway, no stridor  Respiratory - equal breath sounds bilaterally, no wheezing or rales  CV - s1s2 regular rate and rhythm, no murmurs  Ext - no clubbing, no edema  Skin - no rashes  Psych - normal mood and affect     Sleep Tests:  PSG 06/16/16 >> AHI 12, SpO2 low 79% Auto CPAP 07/19/21 to 08/17/21 >> used on 25 of 30 nights with average 6 hrs 53 min.  Average AHI 1.2 with median CPAP 13 and 95 th percentile CPAP 14 cm H2O  Cardiac Tests:  Echo 01/13/18 >> EF 60 to 65%, grade 1, mild AR  Social History:  He  reports that he quit smoking about 44 years ago. His smoking use included cigarettes. He started smoking about 49 years ago. He has a 1.25 pack-year smoking history. He has never used smokeless tobacco. He reports that he does not drink alcohol and does not use drugs.  Family History:  His family history includes Cancer in his  father; Diabetes in his sister; Hypertension in his brother, father, mother, and sister; Stomach cancer in his father; Stroke in his mother.     Assessment/Plan:   Obstructive sleep apnea. - he is compliant with CPAP and reports benefit from therapy - he uses [facility] for his DME - current machine ordered August 2023 - continue auto CPAP 5 to 15 cm H2O - he uses his old machine at his second home  Vaccinations. - discussed indications for RSV and COVID vaccinations  Obesity. - discussed how ozempic can help with weight loss and then might improve sleep apnea control   Allergic rhinitis and conjunctivitis. - he is followed by Dr. Mosetta Anis   Hep C with cirrhosis. - followed by Dr. Silvano Rusk with GI  Time Spent Involved in Patient Care on Day of Examination:  26 minutes  Follow up:   Patient Instructions  Follow up in 1 year  Medication List:   Allergies as of 03/20/2022       Reactions   Clonidine Derivatives    cramps        Medication List        Accurate as of March 20, 2022  5:08 PM. If you have any questions, ask  your nurse or doctor.          amLODipine 10 MG tablet Commonly known as: NORVASC Take 10 mg by mouth daily.   aspirin 81 MG tablet Take 81 mg by mouth daily.   azelastine 0.05 % ophthalmic solution Commonly known as: OPTIVAR Place 2 drops into both eyes as needed.   b complex vitamins capsule Take 1 capsule by mouth daily.   fexofenadine 180 MG tablet Commonly known as: ALLEGRA Take 180 mg by mouth daily as needed.   glimepiride 2 MG tablet Commonly known as: AMARYL TAKE 1 TABLET BY MOUTH  DAILY BEFORE BREAKFAST What changed:  how to take this additional instructions   hydrochlorothiazide 25 MG tablet Commonly known as: HYDRODIURIL Take 25 mg by mouth daily.   levocetirizine 5 MG tablet Commonly known as: XYZAL Take 5 mg by mouth at bedtime as needed.   magnesium oxide 400 MG tablet Commonly known as:  MAG-OX Take 1 tablet by mouth daily.   MegaRed Omega-3 Krill Oil 500 MG Caps Take 1 capsule by mouth daily.   metFORMIN 500 MG 24 hr tablet Commonly known as: GLUCOPHAGE-XR Take 1,000 mg by mouth 2 (two) times daily.   metoprolol succinate 100 MG 24 hr tablet Commonly known as: TOPROL-XL TAKE 1 TABLET BY MOUTH  DAILY BUT IF BP IS GREATER  THAN 140/90 THEN TAKE 2  TABLETS BY MOUTH DAILY   montelukast 10 MG tablet Commonly known as: SINGULAIR Take 10 mg by mouth at bedtime as needed.   multivitamin tablet Take 1 tablet by mouth daily.   ONE TOUCH ULTRA TEST test strip Generic drug: glucose blood USE DAILY AS DIRECTED   OneTouch Delica Lancets 92Z Misc Use daily to check sugar as needed.  Dx E11.9   Ozempic (0.25 or 0.5 MG/DOSE) 2 MG/3ML Sopn Generic drug: Semaglutide(0.25 or 0.'5MG'$ /DOS) Inject into the skin.   spironolactone 25 MG tablet Commonly known as: ALDACTONE Take 25 mg by mouth once.   valsartan 320 MG tablet Commonly known as: DIOVAN Take 320 mg by mouth daily.        Signature:  Chesley Mires, MD [place] Pager - [phone] 03/20/2022, 5:08 PM

## 2022-06-02 NOTE — Progress Notes (Signed)
Patient ID: Alexander [name], male   DOB: 06/17/1950, 72 y.o.   MRN: [id number]   72 y.o. initially seen for abnormal ECG 12/2013. Chronic LAD inferolateral T wave changes Previous smoker with HTN, DM And HLD. Previous hepatitis C Quit smoking over 40 years ago. 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  Son does sports for [employer] and is home for vacation   No cardiac symptoms   ROS: Denies fever, malais, weight loss, blurry vision, decreased visual acuity, cough, sputum, SOB, hemoptysis, pleuritic pain, palpitaitons, heartburn, abdominal pain, melena, lower extremity edema, claudication, or rash.  All other systems reviewed and negative   General: BP 124/76   Pulse 83   Ht 6\' 3"  (1.905 m)   Wt 261 lb 9.6 oz (118.7 kg)   SpO2 98%   BMI 32.70 kg/m  Affect appropriate Overweight black male  HEENT: post thyroid lobectomy  Neck supple with no adenopathy JVP normal no bruits no thyromegaly Lungs clear with no wheezing and good diaphragmatic motion Heart:  S1/S2 SEM murmur, no rub, gallop or click PMI normal Abdomen: benighn, BS positve, no tenderness, no AAA no bruit.  No HSM or HJR Distal pulses intact with no bruits No edema Neuro non-focal Skin warm and dry No muscular weakness  Skin warm and dry No muscular weakness  Medications Current Outpatient Medications  Medication Sig Dispense Refill   amLODipine (NORVASC) 10 MG tablet Take 10 mg by mouth daily.     aspirin 81 MG tablet Take 81 mg by mouth daily.     azelastine (OPTIVAR) 0.05 % ophthalmic solution Place 2 drops into both eyes as needed.     fexofenadine (ALLEGRA)  180 MG tablet Take 180 mg by mouth daily as needed.      glimepiride (AMARYL) 2 MG tablet TAKE 1 TABLET BY MOUTH  DAILY BEFORE BREAKFAST 90 tablet 2   hydrochlorothiazide (HYDRODIURIL) 25 MG tablet Take 25 mg by mouth daily.     indomethacin (INDOCIN) 50 MG capsule Take by mouth.     levocetirizine (XYZAL) 5 MG tablet Take 5 mg by mouth at bedtime as needed.  0   magnesium oxide (MAG-OX) 400 MG tablet Take 1 tablet by mouth daily.     MegaRed Omega-3 Krill Oil 500 MG CAPS Take 1 capsule by mouth daily.     metFORMIN (GLUCOPHAGE-XR) 500 MG 24 hr tablet Take 1,000 mg by mouth 2 (two) times daily.     metoprolol succinate (TOPROL-XL) 100 MG 24 hr tablet TAKE 1 TABLET BY MOUTH  DAILY BUT IF BP IS GREATER  THAN 140/90 THEN TAKE 2  TABLETS BY MOUTH DAILY 180 tablet 0   montelukast (SINGULAIR) 10 MG tablet Take 10 mg by mouth at bedtime as needed.      Multiple Vitamin (MULTIVITAMIN) tablet Take 1 tablet by mouth daily.     ONE TOUCH ULTRA TEST test strip USE DAILY AS DIRECTED 100 each 4   ONETOUCH DELICA LANCETS 33G MISC Use daily to check sugar as needed.  Dx E11.9 100 each 3   OZEMPIC, 0.25 OR 0.5 MG/DOSE, 2  MG/3ML SOPN Inject into the skin.     spironolactone (ALDACTONE) 25 MG tablet Take 25 mg by mouth once.      valsartan (DIOVAN) 320 MG tablet Take 320 mg by mouth daily.     vitamin B-12 (CYANOCOBALAMIN) 500 MCG tablet Take 500 mcg by mouth daily.     No current facility-administered medications for this visit.    Allergies Clonidine derivatives  Family History: Family History  Problem Relation Age of Onset   Hypertension Mother    Stroke Mother    Cancer Father        stomach CA   Hypertension Father    Stomach cancer Father    Hypertension Sister    Diabetes Sister    Hypertension Brother    Prostate cancer Neg Hx    Colon cancer Neg Hx     Social History: Social History   Socioeconomic History   Marital status: Married    Spouse name: Velna Hatchet   Number of children: Not  on file   Years of education: Not on file   Highest education level: Not on file  Occupational History   Occupation: Retired    Associate Professor: [employer]    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[employer]  Tobacco Use   Smoking status: Former    Packs/day: 0.25    Years: 5.00    Additional pack years: 0.00    Total pack years: 1.25    Types: Cigarettes    Start date: 60    Quit date: 02/10/1978    Years since quitting: 44.3   Smokeless tobacco: Never  Vaping Use   Vaping Use: Never used  Substance and Sexual Activity   Alcohol use: No    Alcohol/week: 0.0 standard drinks of alcohol   Drug use: No   Sexual activity: Not on file  Other Topics Concern   Not on file  Social History Narrative   Married 1978, Velna Hatchet   Grown children - 3 sons 4 grandkids   Orthoptist, 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   He is retired from [employer]   Former smoker, no alcohol no drug use   Social Determinants of Corporate investment banker Strain: Not on Ship broker Insecurity: Not on file  Transportation Needs: Not on file  Physical Activity: Not on file  Stress: Not on file  Social Connections: Not on file  Intimate Partner Violence: Not on file    Past Surgical History:  Procedure Laterality Date   COLONOSCOPY     ESOPHAGOGASTRODUODENOSCOPY     multiple   THYROID LOBECTOMY  2005   right    Past Medical History:  Diagnosis Date   Allergy    Arthritis    Chronic hepatitis C without mention of hepatic coma    treatment at [facility] 2014   Cirrhosis of liver without mention of alcohol    Diabetes mellitus    type II   Full dentures    Hypertension    OSA (obstructive sleep apnea) 09/29/2016   Sleep apnea    on CPap   Tuberculin test reaction    Wears glasses     Electrocardiogram:  06/09/2022 SR rate 83 LAD ? Old IMI   Assessment and Plan  Abnormal ECG chronic no evidence of MI or structural abnormality on echo/ myovue Discussed risk stratification for CAD with calcium  score   AV Disease no change in murmur AV sclerosis with mild AR/MR on TTE 04/16/21  observe  HTN: Well controlled.  Continue current medications and low sodium Dash type diet.    DM: Discussed low carb diet.  Target hemoglobin A1c is 6.5 or less.  Continue current medications.  Ortho:  Stable no need for TKR at this time   OSA:  F/u Dr Craige Cotta compliant ? Inspiris device referral   Hepatitis C:  F/u Dr Leone Payor compensated post eradication 2014  [place] 11/19/20 stable elasticity With minimal scarring ? More steatosis and metabolic syndrome   Calcium Score   F/U in a year    Charlton Haws

## 2022-06-09 ENCOUNTER — Encounter: Payer: Self-pay | Admitting: Cardiovascular Disease

## 2022-06-09 ENCOUNTER — Ambulatory Visit: Payer: Medicare Other | Attending: Cardiovascular Disease | Admitting: Cardiovascular Disease

## 2022-06-09 VITALS — BP 124/76 | HR 83 | Ht 75.0 in | Wt 261.6 lb

## 2022-06-09 DIAGNOSIS — I10 Essential (primary) hypertension: Secondary | ICD-10-CM | POA: Diagnosis not present

## 2022-06-09 DIAGNOSIS — R9431 Abnormal electrocardiogram [ECG] [EKG]: Secondary | ICD-10-CM | POA: Diagnosis not present

## 2022-06-09 DIAGNOSIS — R011 Cardiac murmur, unspecified: Secondary | ICD-10-CM | POA: Diagnosis not present

## 2022-06-09 NOTE — Patient Instructions (Addendum)
Medication Instructions:  Your physician recommends that you continue on your current medications as directed. Please refer to the Current Medication list given to you today.  *If you need a refill on your cardiac medications before your next appointment, please call your pharmacy*  Lab Work: If you have labs (blood work) drawn today and your tests are completely normal, you will receive your results only by: MyChart Message (if you have MyChart) OR A paper copy in the mail If you have any lab test that is abnormal or we need to change your treatment, we will call you to review the results.  Testing/Procedures: Cardiac CT scanning for calcium score, (CAT scanning), is a noninvasive, special x-ray that produces cross-sectional images of the body using x-rays and a computer. CT scans help physicians diagnose and treat medical conditions. For some CT exams, a contrast material is used to enhance visibility in the area of the body being studied. CT scans provide greater clarity and reveal more details than regular x-ray exams.   Follow-Up: At [facility], you and your health needs are our priority.  As part of our continuing mission to provide you with exceptional heart care, we have created designated Provider Care Teams.  These Care Teams include your primary Cardiologist (physician) and Advanced Practice Providers (APPs -  Physician Assistants and Nurse Practitioners) who all work together to provide you with the care you need, when you need it.  We recommend signing up for the patient portal called "MyChart".  Sign up information is provided on this After Visit Summary.  MyChart is used to connect with patients for Virtual Visits (Telemedicine).  Patients are able to view lab/test results, encounter notes, upcoming appointments, etc.  Non-urgent messages can be sent to your provider as well.   To learn more about what you can do with MyChart, go to [email or web address].    Your next  appointment:   1 year(s)  Provider:   Peter Nishan, MD     

## 2022-07-01 ENCOUNTER — Ambulatory Visit
Admission: RE | Admit: 2022-07-01 | Discharge: 2022-07-01 | Disposition: A | Discharge status: Home / Self Care | Payer: Medicare Other | Source: Ambulatory Visit | Attending: Physical Medicine and Rehabilitation | Admitting: Physical Medicine and Rehabilitation

## 2022-07-01 ENCOUNTER — Other Ambulatory Visit: Payer: Self-pay | Admitting: Physical Medicine and Rehabilitation

## 2022-07-01 DIAGNOSIS — M064 Inflammatory polyarthropathy: Secondary | ICD-10-CM

## 2022-07-01 DIAGNOSIS — M25432 Effusion, left wrist: Secondary | ICD-10-CM

## 2022-07-01 DIAGNOSIS — M79642 Pain in left hand: Secondary | ICD-10-CM

## 2022-07-12 IMAGING — US US ABDOMEN LIMITED
1 series · 14 of 25 positions shown · non-contrast
Comparison: 08/30/2019

CLINICAL DATA: Cirrhosis

EXAM:
ULTRASOUND ABDOMEN LIMITED RIGHT UPPER QUADRANT

[Series 1: us abdomen limited · 14 of 50 slices shown]
[im 1/50]
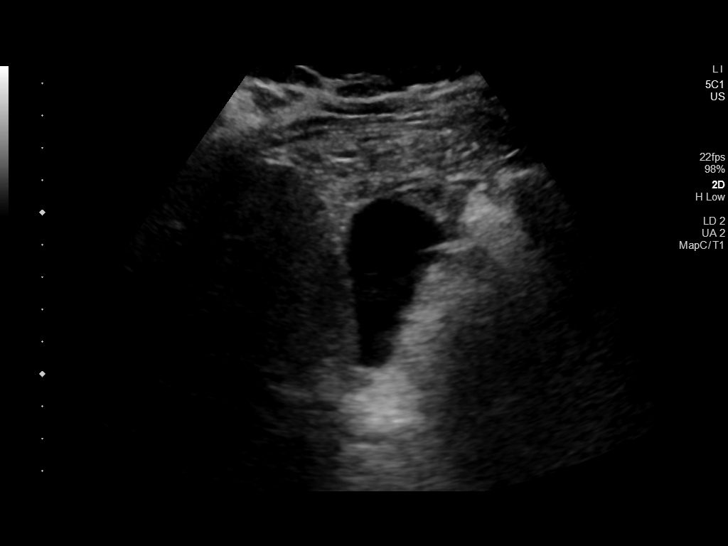
[im 5/50]
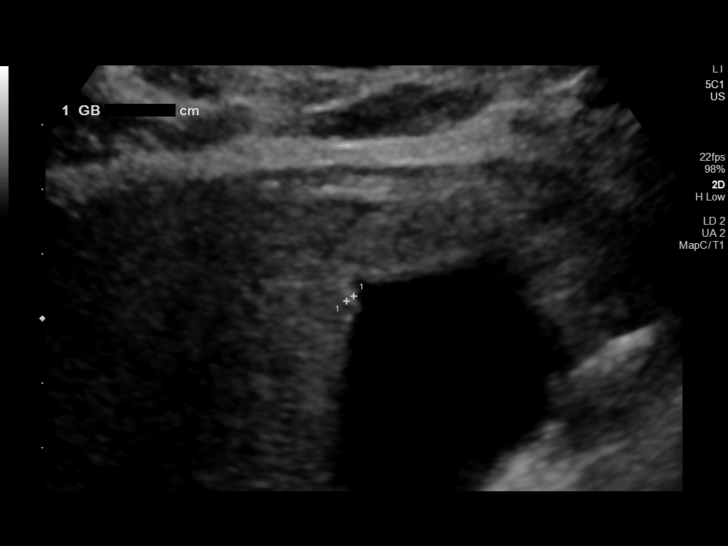
[im 9/50]
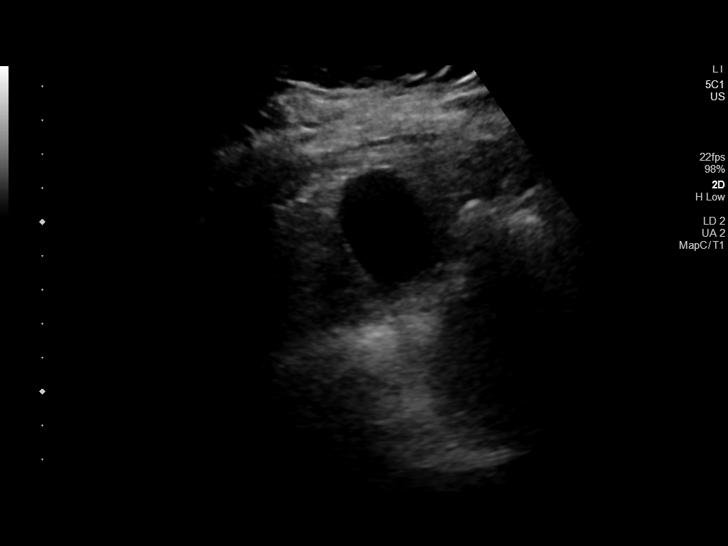
[im 13/50]
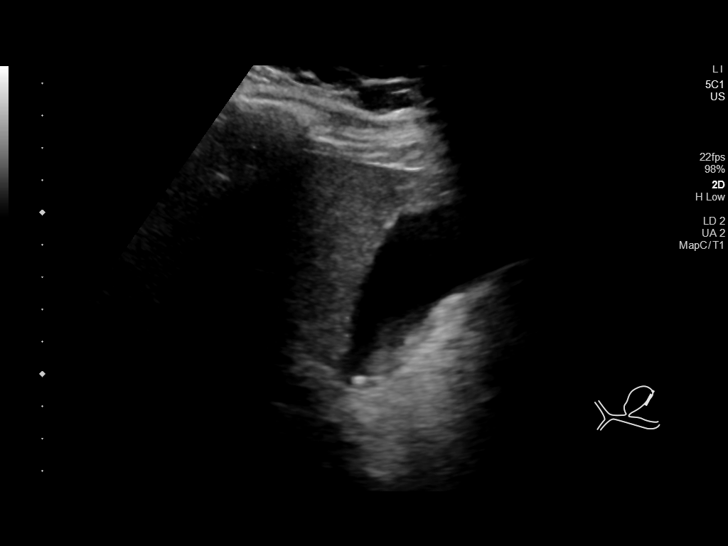
[im 17/50]
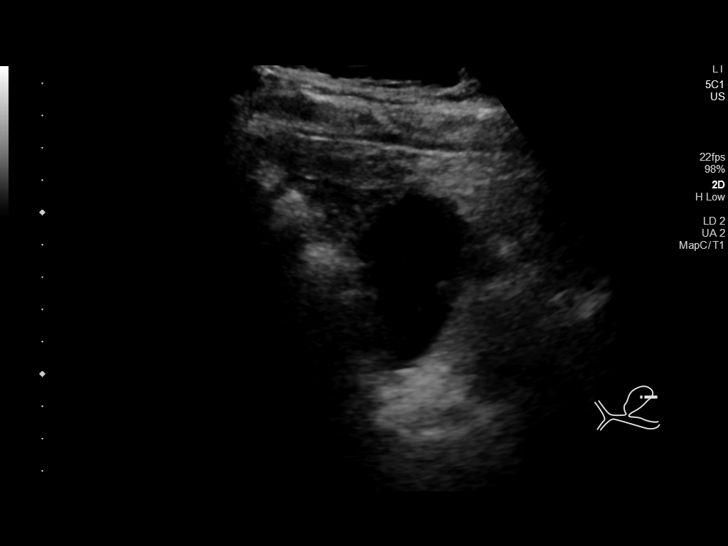
[im 19/50]
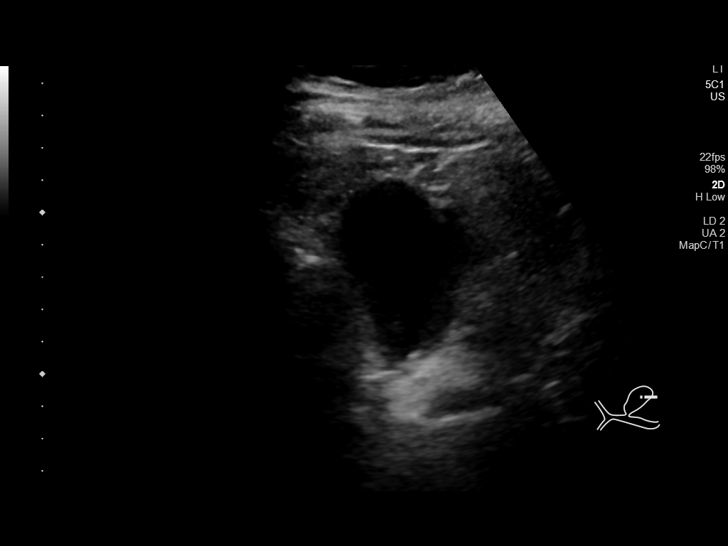
[im 23/50]
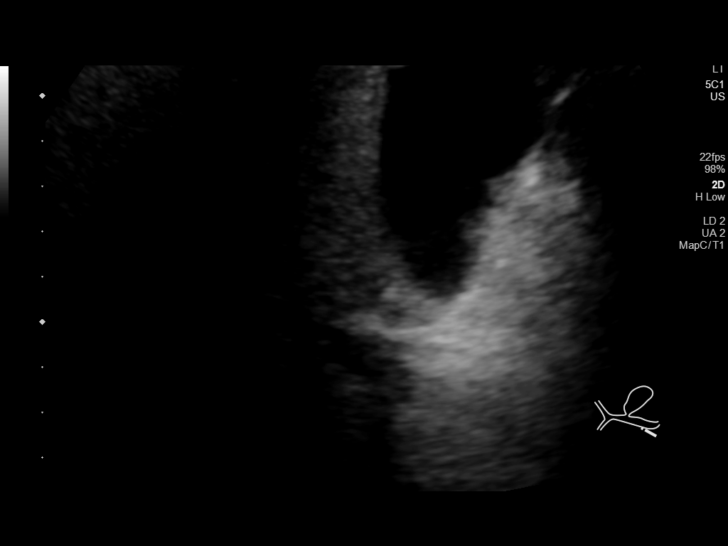
[im 27/50]
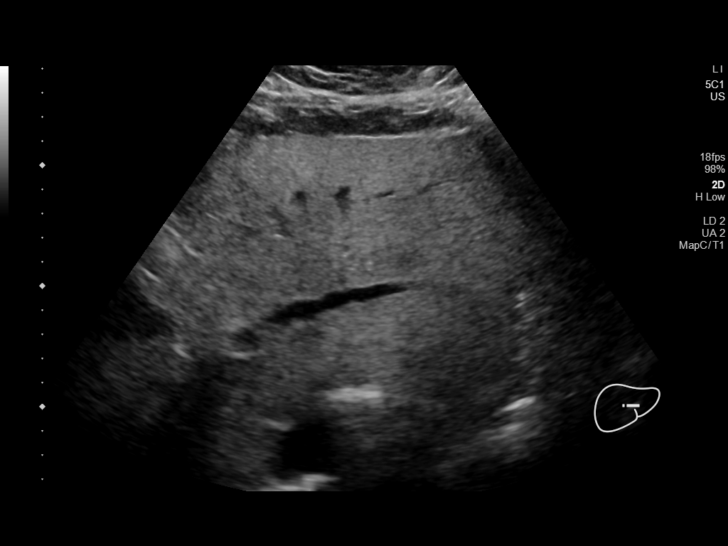
[im 31/50]
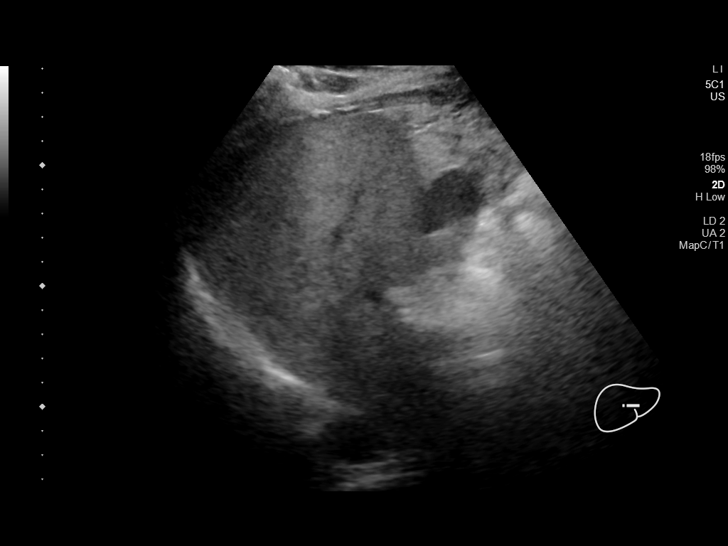
[im 33/50]
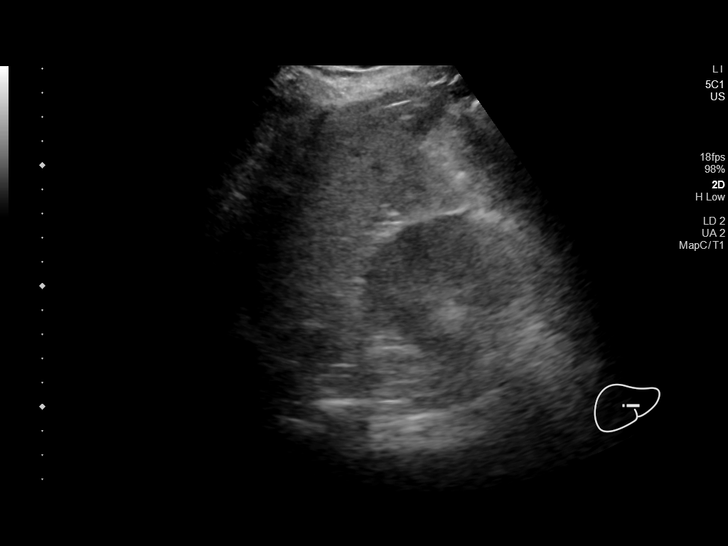
[im 37/50]
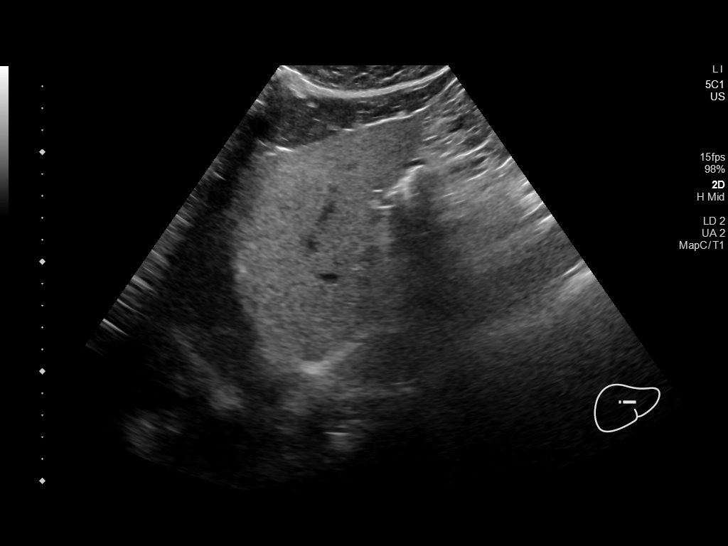
[im 41/50]
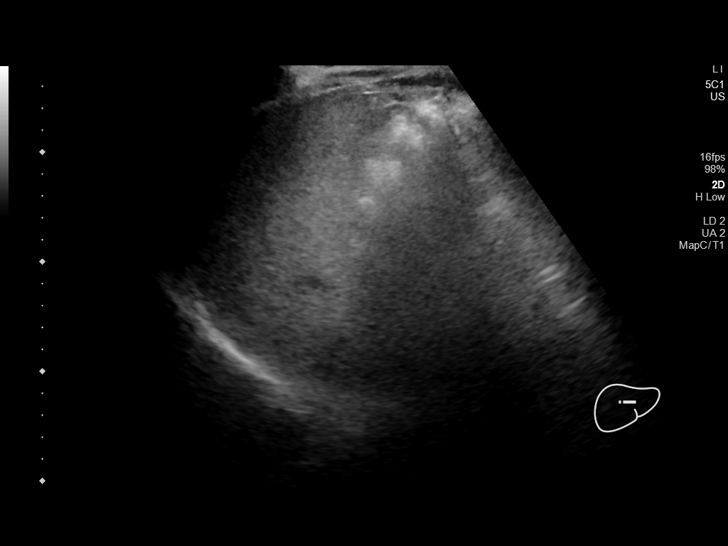
[im 45/50]
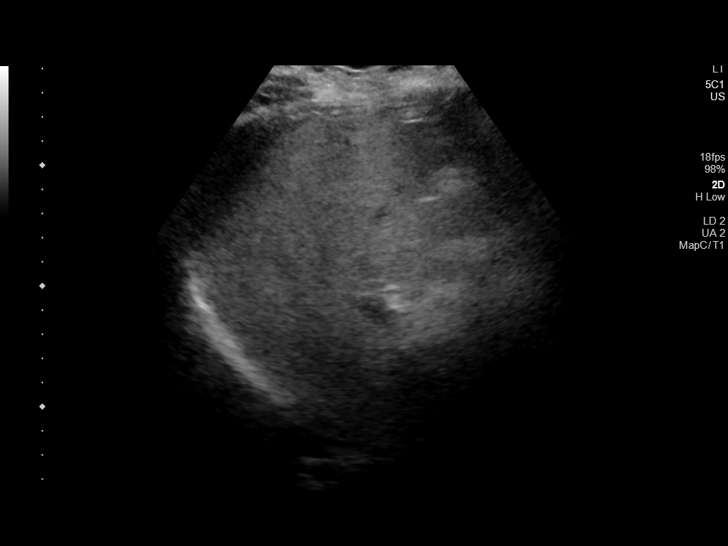
[im 50/50]
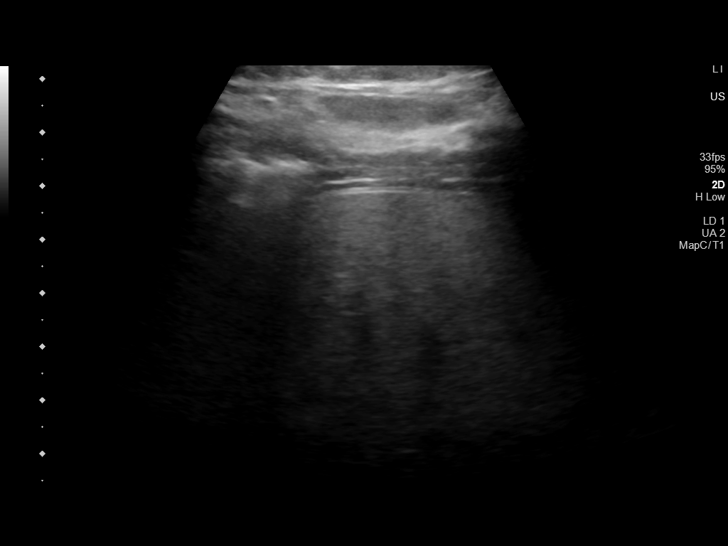

[14 of 25 positions shown; findings below may reference images not displayed]

FINDINGS: Gallbladder:

6 mm calculus dependently in gallbladder. Small amount of
gallbladder sludge. No pericholecystic fluid, gallbladder wall
thickening or sonographic Murphy sign.

Common bile duct:

Diameter: 2 mm, normal

Liver:

Heterogeneous increased echogenicity with nodular contours
consistent with cirrhosis. No discrete hepatic mass identified.
Portal vein is patent on color Doppler imaging with normal direction
of blood flow towards the liver.

Other: No RIGHT upper quadrant free fluid.
IMPRESSION: 6 mm gallstone and sludge within gallbladder.

Cirrhotic appearing liver without mass.

## 2022-07-14 ENCOUNTER — Ambulatory Visit (HOSPITAL_BASED_OUTPATIENT_CLINIC_OR_DEPARTMENT_OTHER)
Admission: RE | Admit: 2022-07-14 | Discharge: 2022-07-14 | Disposition: A | Discharge status: Home / Self Care | Payer: Medicare Other | Source: Ambulatory Visit | Attending: Cardiovascular Disease | Admitting: Cardiovascular Disease

## 2022-07-14 DIAGNOSIS — R9431 Abnormal electrocardiogram [ECG] [EKG]: Secondary | ICD-10-CM | POA: Insufficient documentation

## 2022-12-26 ENCOUNTER — Encounter: Payer: Self-pay | Admitting: Internal Medicine

## 2023-01-23 IMAGING — US US ABDOMEN LIMITED
1 series · 15 of 25 positions shown · non-contrast
Comparison: 02/14/2020

CLINICAL DATA: Cirrhosis

EXAM:
ULTRASOUND ABDOMEN LIMITED RIGHT UPPER QUADRANT

[Series 1: us abdomen limited ruq mc & wl · 15 of 77 slices shown]
[im 1/77]
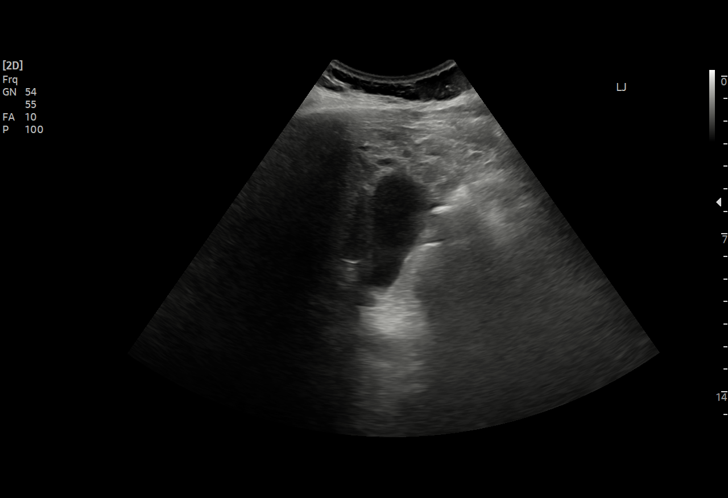
[im 7/77]
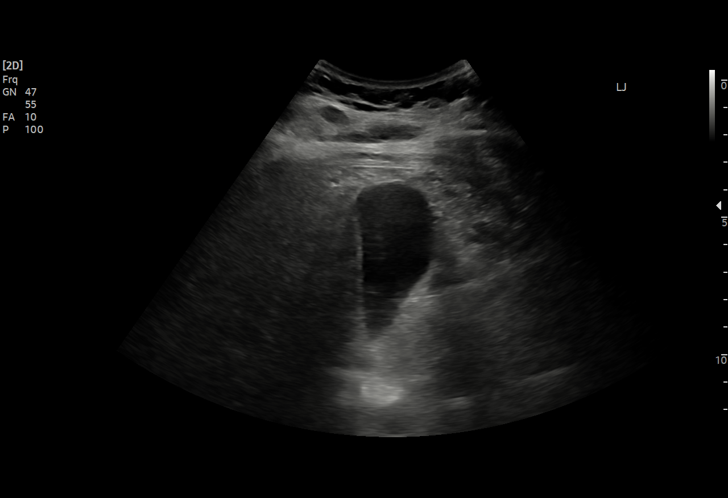
[im 13/77]
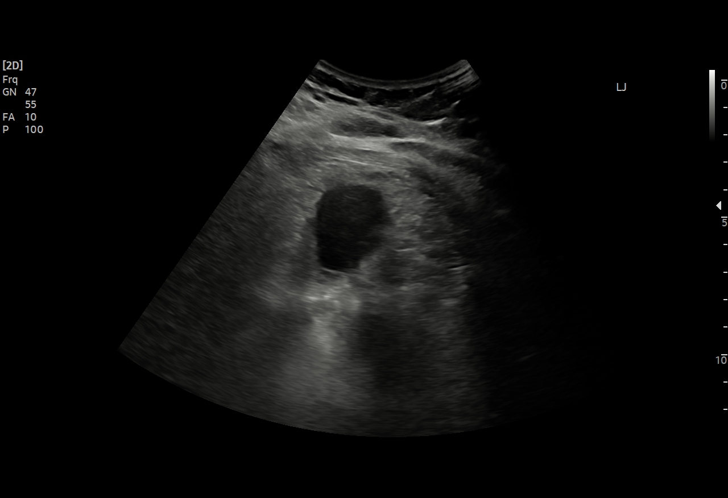
[im 16/77]
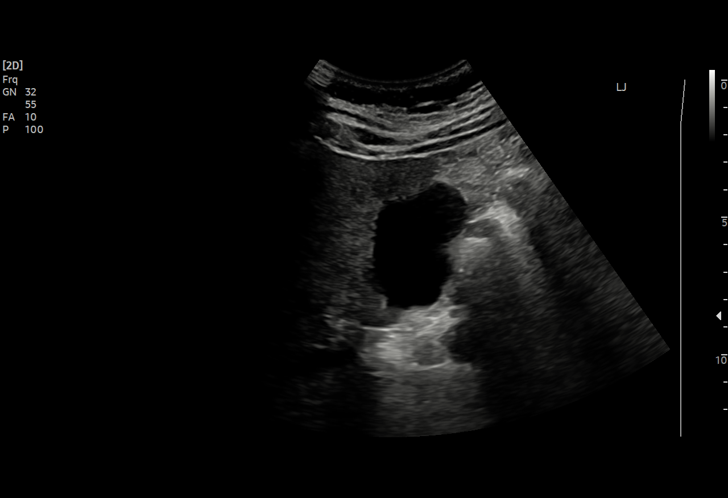
[im 23/77]
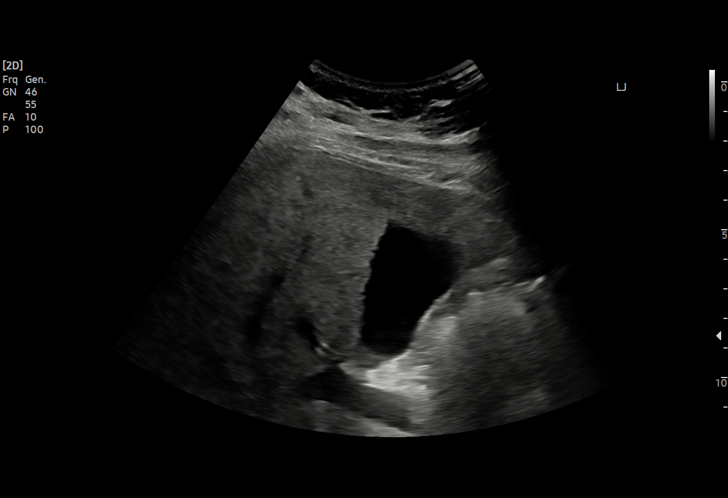
[im 29/77]
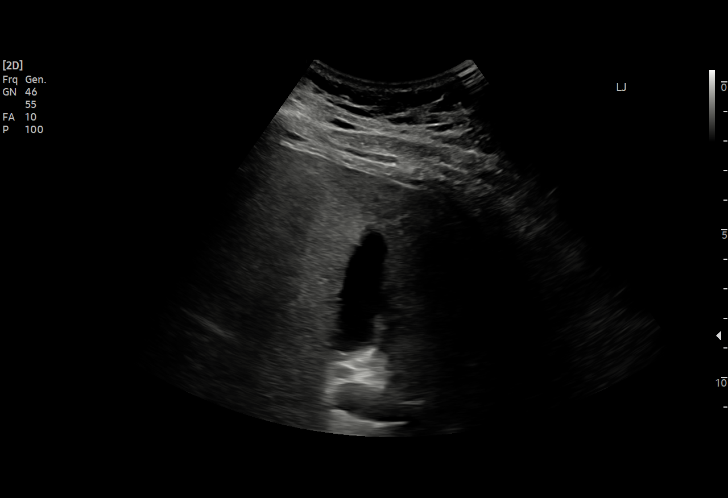
[im 32/77]
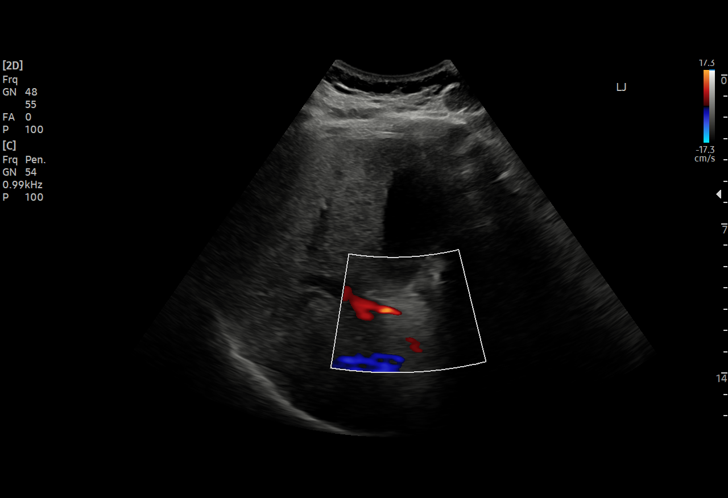
[im 39/77]
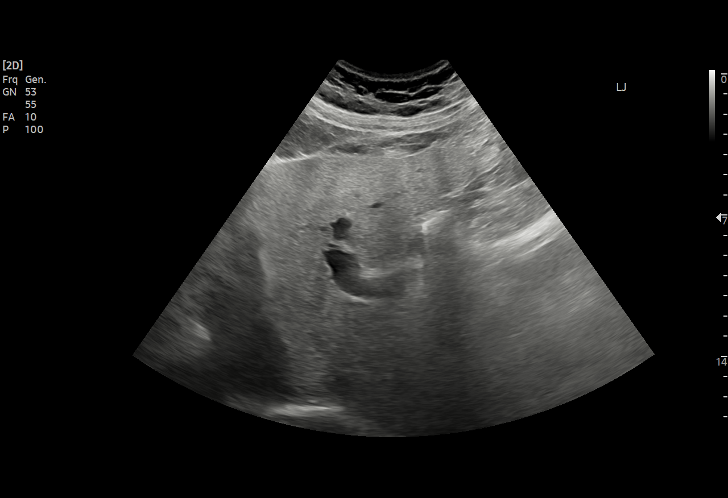
[im 45/77]
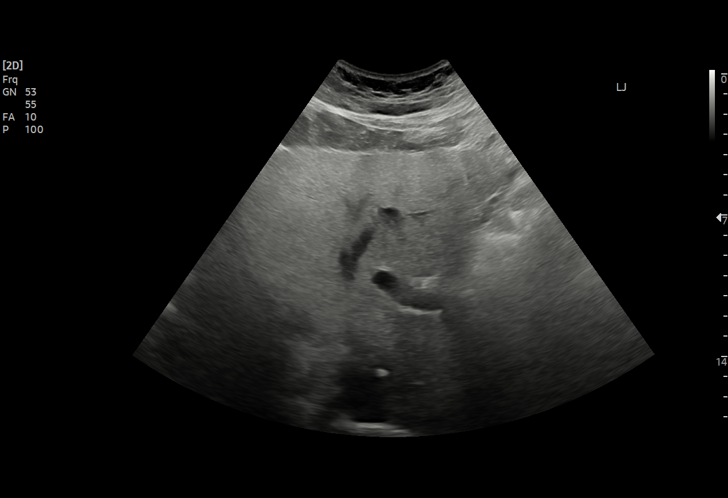
[im 48/77]
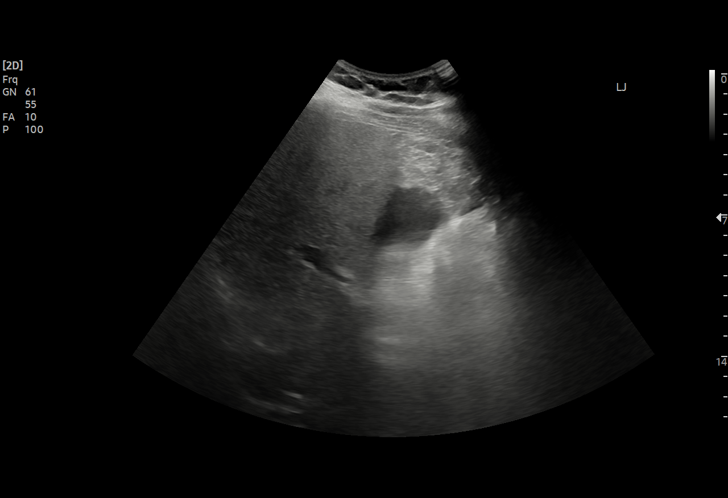
[im 54/77]
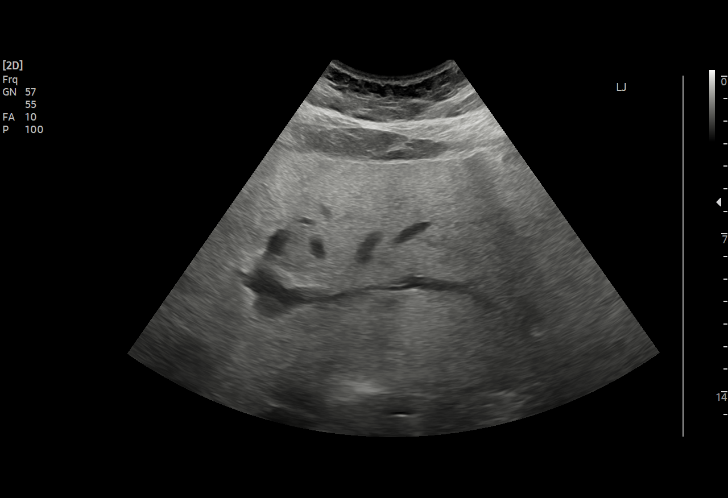
[im 61/77]
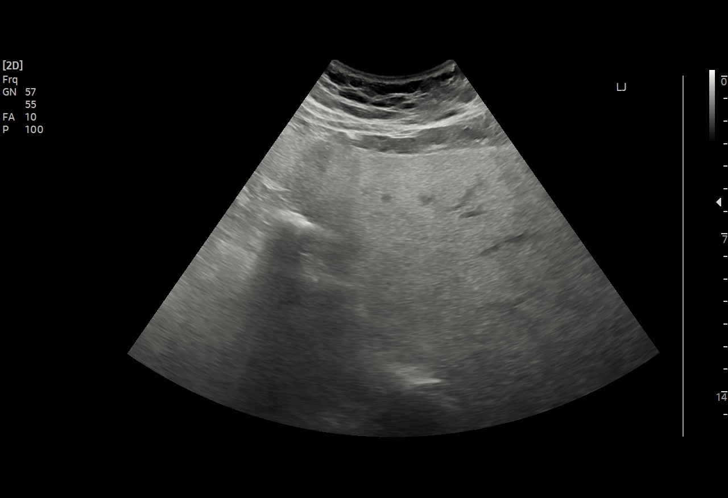
[im 64/77]
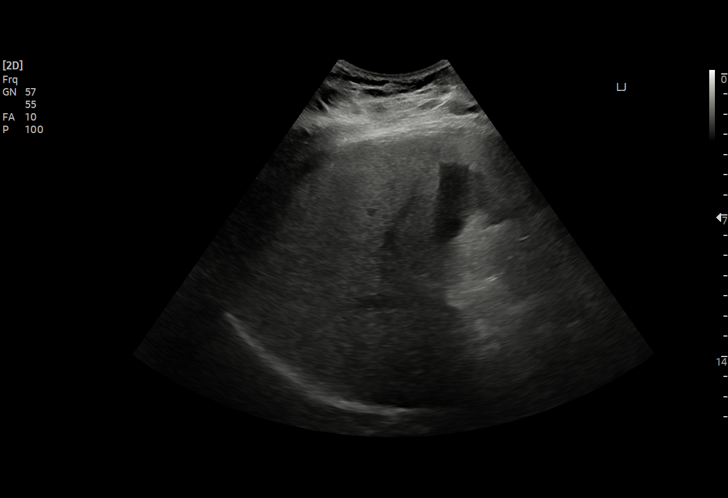
[im 70/77]
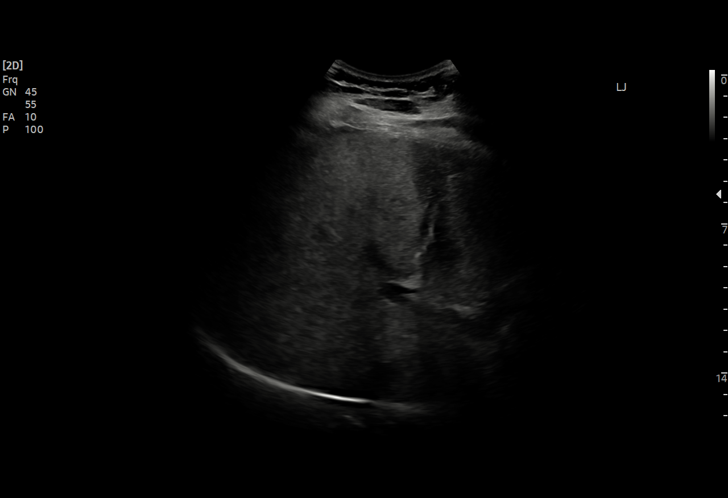
[im 77/77]
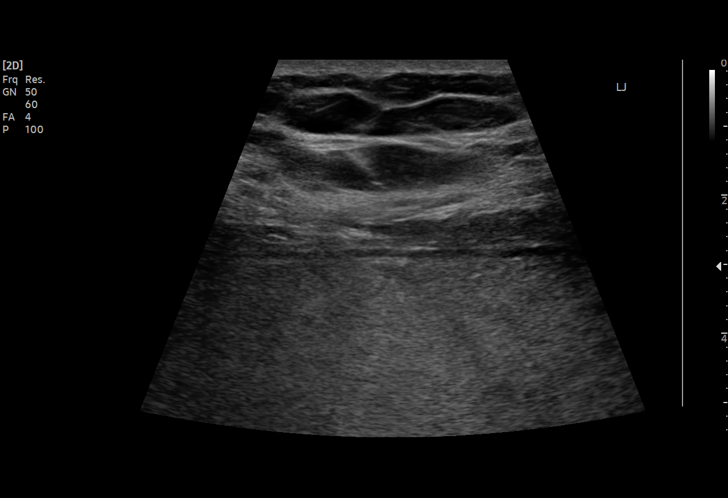

[15 of 25 positions shown; findings below may reference images not displayed]

FINDINGS: Gallbladder:

Minimal cholelithiasis. No gallbladder wall thickening or
pericholecystic fluid. Sonographic Murphy sign is negative per
technologist.

Common bile duct:

Diameter: 4 mm

Liver:

No focal lesion.

Normal hepatic contours.

Diffusely increased parenchymal echogenicity.

Portal vein is patent on color Doppler imaging with normal direction
of blood flow towards the liver.

Other: None.
IMPRESSION: 1. Minimal cholelithiasis.
2. Diffuse increased echogenicity of the hepatic parenchyma is a
nonspecific indicator of hepatocellular dysfunction, most commonly
steatosis.

## 2023-04-01 ENCOUNTER — Ambulatory Visit: Payer: Medicare Other | Admitting: Internal Medicine

## 2023-04-17 IMAGING — US US ABDOMEN COMPLETE W/ ELASTOGRAPHY
1 series · 12 of 25 positions shown · non-contrast
Comparison: Ultrasound 08/27/2020

CLINICAL DATA: Chronic hepatitis C, cirrhosis.

EXAM:
ULTRASOUND ABDOMEN
ULTRASOUND HEPATIC ELASTOGRAPHY
TECHNIQUE: Sonography of the upper abdomen was performed. In addition,
ultrasound elastography evaluation of the liver was performed. A
region of interest was placed within the right lobe of the liver.
Following application of a compressive sonographic pulse, tissue
compressibility was assessed. Multiple assessments were performed at
the selected site. Median tissue compressibility was determined.
Previously, hepatic stiffness was assessed by shear wave velocity.
Based on recently published Society of Radiologists in Ultrasound
consensus article, reporting is now recommended to be performed in
the SI units of pressure (kiloPascals) representing hepatic
stiffness/elasticity. The obtained result is compared to the
published reference standards. (cACLD = compensated Advanced Chronic
Liver Disease)

[Series 1: us abdomen complete w/ elastography · 12 of 122 slices shown]
[im 6/122]
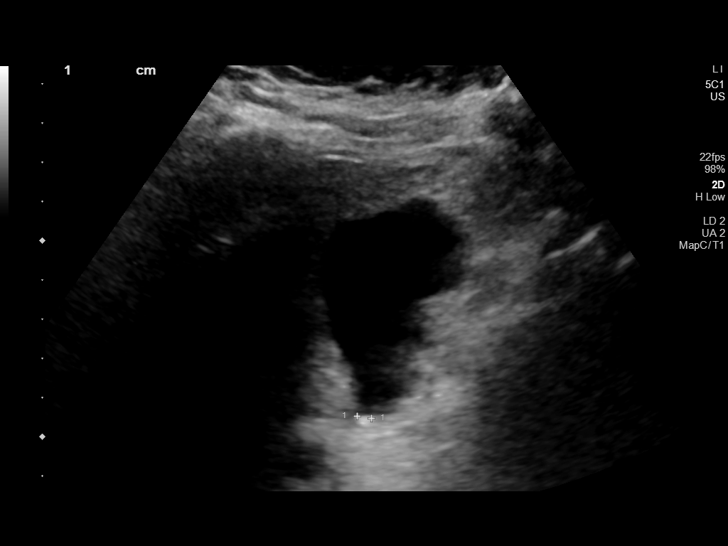
[im 16/122]
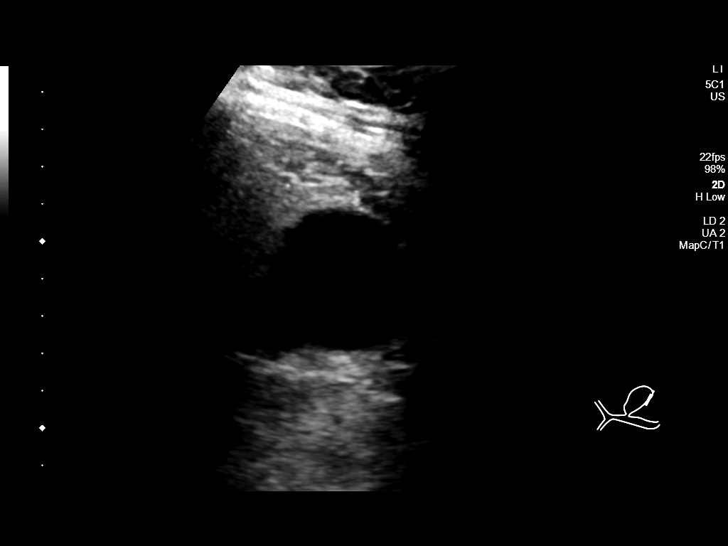
[im 26/122]
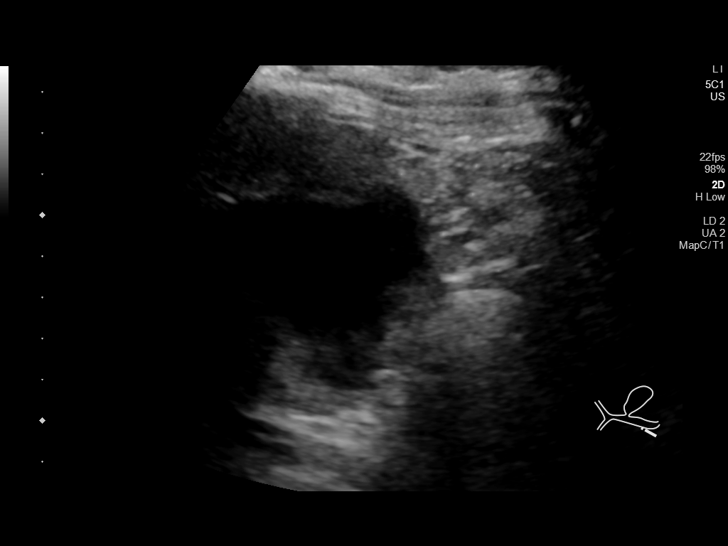
[im 36/122]
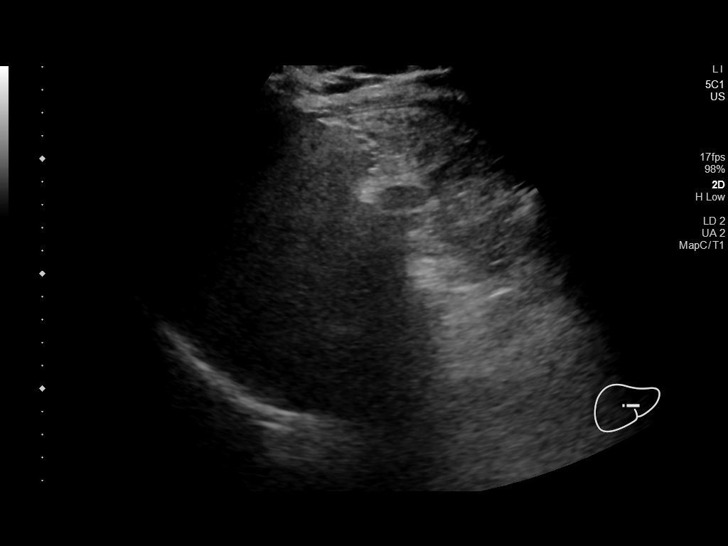
[im 46/122]
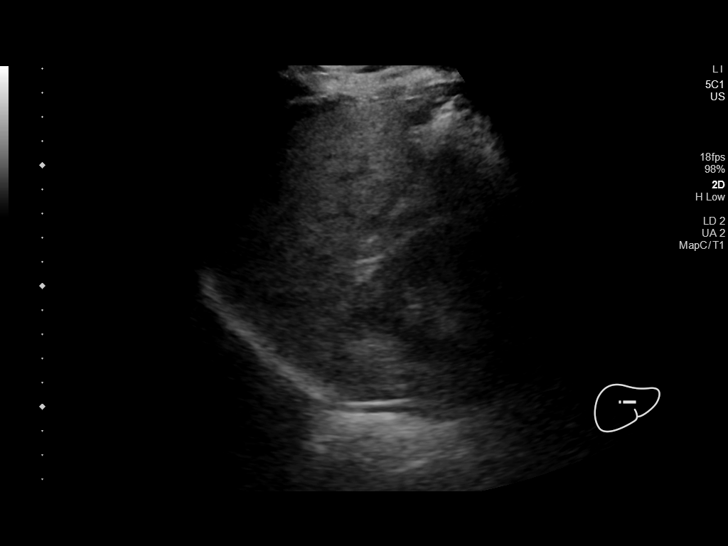
[im 56/122]
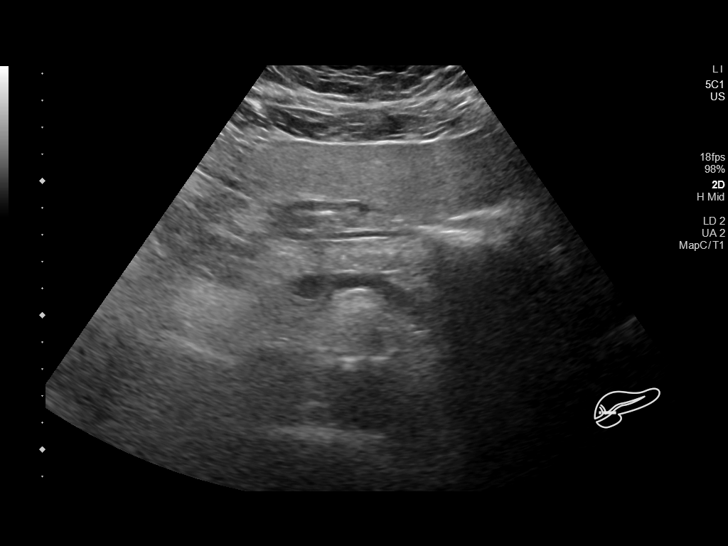
[im 66/122]
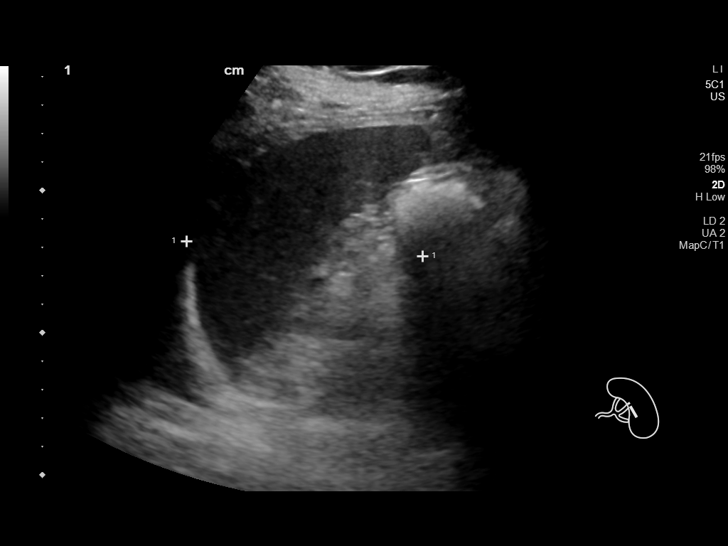
[im 76/122]
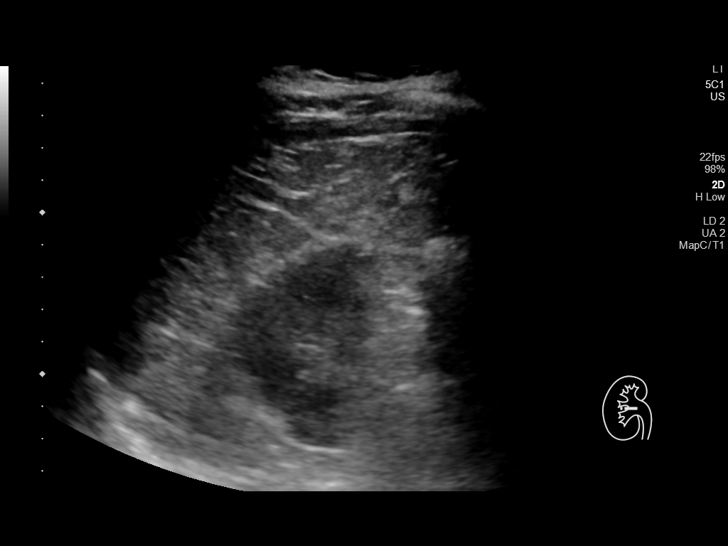
[im 86/122]
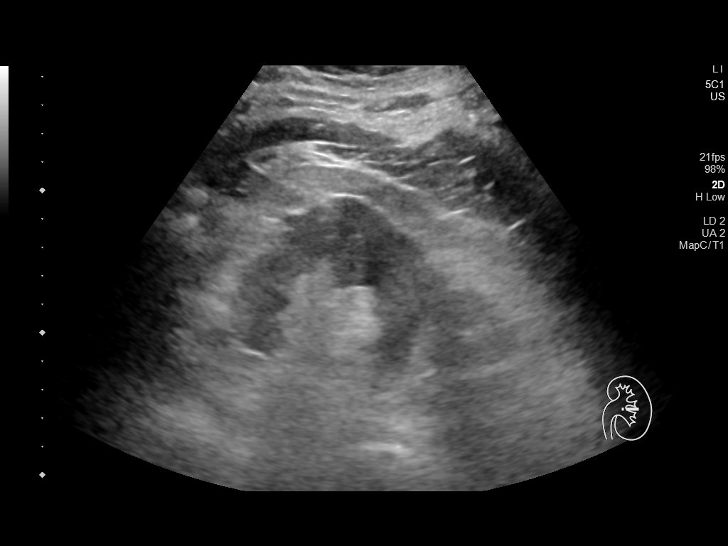
[im 96/122]
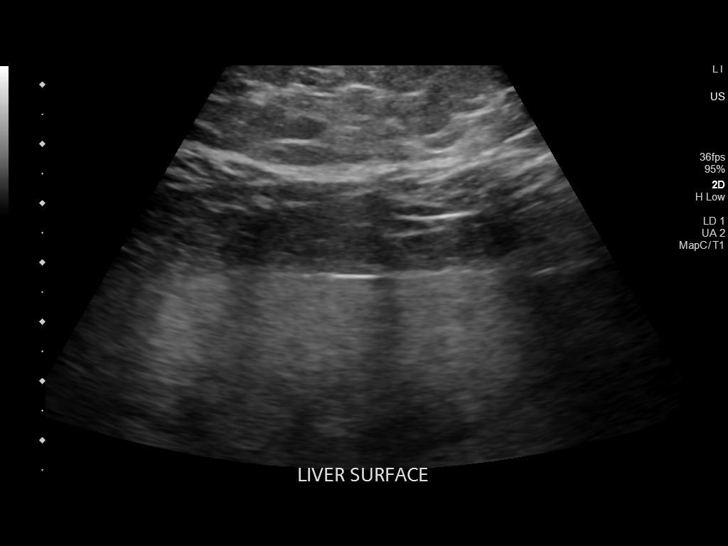
[im 106/122]
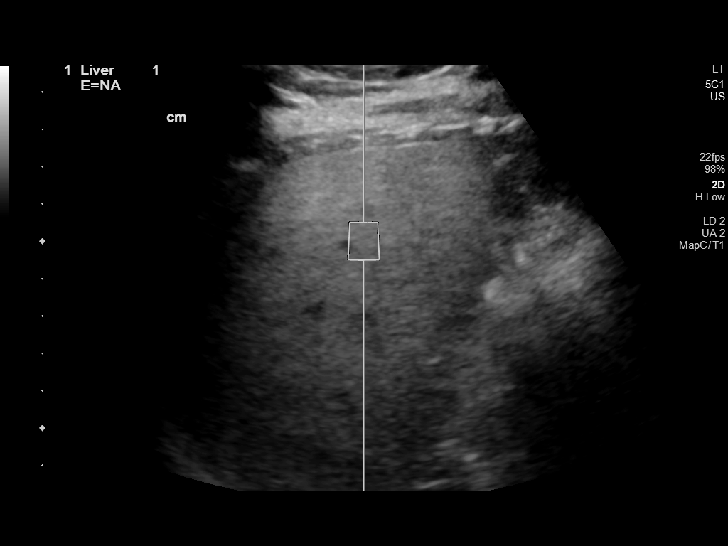
[im 116/122]
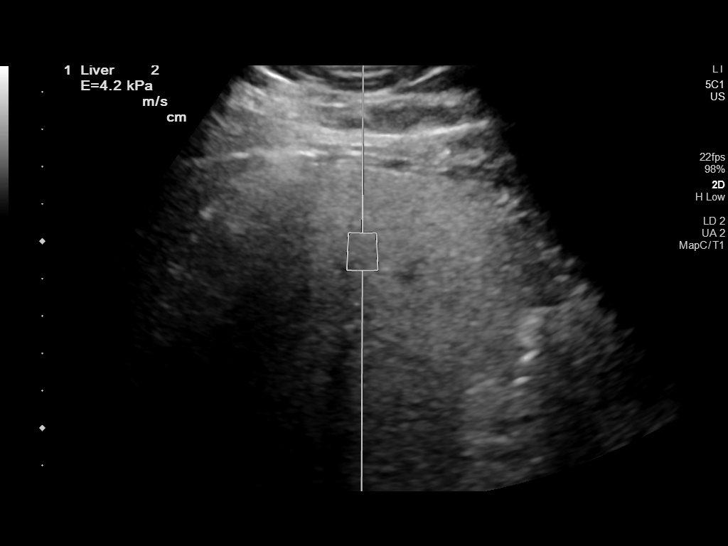

[12 of 25 positions shown; findings below may reference images not displayed]

FINDINGS: ULTRASOUND ABDOMEN

Gallbladder: 5 mm stone in the neck of the gallbladder. No
pericholecystic fluid or wall thickening visualized. No sonographic
Murphy sign noted by sonographer.

Common bile duct: Diameter: 3 mm

Liver: No focal lesion identified. Coarsened hepatic echotexture
with diffusely increased hepatic echogenicity and nodular hepatic
contour. Portal vein is patent on color Doppler imaging with normal
direction of blood flow towards the liver.

IVC: No abnormality visualized.

Pancreas: Visualized portion unremarkable.

Spleen: Size and appearance within normal limits.

Right Kidney: Length: 11.1 cm. Echogenicity within normal limits. No
mass or hydronephrosis visualized.

Left Kidney: Length: 13.3 cm. Echogenicity within normal limits. No
mass or hydronephrosis visualized.

Abdominal aorta: No aneurysm visualized.

Other findings: None.

ULTRASOUND HEPATIC ELASTOGRAPHY

Device: Siemens Helix VTQ

Patient position: Oblique

Transducer 5C1

Number of measurements: 10

Hepatic segment:  8

Median kPa:

IQR: 1

IQR/Median kPa ratio:

Data quality:  Good

Diagnostic category: < or = 9 kPa: in the absence of other known
clinical signs, rules out cACLD

The use of hepatic elastography is applicable to patients with viral
hepatitis and non-alcoholic fatty liver disease. At this time, there
is insufficient data for the referenced cut-off values and use in
other causes of liver disease, including alcoholic liver disease.
Patients, however, may be assessed by elastography and serve as
their own reference standard/baseline.

In patients with non-alcoholic liver disease, the values suggesting
compensated advanced chronic liver disease (cACLD) may be lower, and
patients may need additional testing with elasticity results of [DATE]
kPa.

Please note that abnormal hepatic elasticity and shear wave
velocities may also be identified in clinical settings other than
with hepatic fibrosis, such as: acute hepatitis, elevated right
heart and central venous pressures including use of beta blockers,
Aprilya disease ([place]), infiltrative processes such as
mastocytosis/amyloidosis/infiltrative tumor/lymphoma, extrahepatic
cholestasis, with hyperemia in the post-prandial state, and with
liver transplantation. Correlation with patient history, laboratory
data, and clinical condition recommended.

Diagnostic Categories:

< or =5 kPa: high probability of being normal

< or =9 kPa: in the absence of other known clinical signs, rules [DATE] kPa and ?13 kPa: suggestive of cACLD, but needs further testing

>13 kPa: highly suggestive of cACLD

> or =17 kPa: highly suggestive of cACLD with an increased
probability of clinically significant portal hypertension
IMPRESSION: ULTRASOUND ABDOMEN:

Diffusely increased parenchymal echogenicity with coarsened hepatic
echotexture and nodular hepatic contour suggestive of hepatic
cirrhosis. No discrete hepatic lesion visualized.

ULTRASOUND HEPATIC ELASTOGRAPHY:

Median kPa:

Diagnostic category: < or = 9 kPa: in the absence of other known
clinical signs, rules out cACLD

## 2023-05-26 NOTE — Progress Notes (Signed)
 Patient ID: Alexander [name], male   DOB: May 28, 1950, 74 y.o.   MRN: [id number]   73 y.o. initially seen for abnormal ECG 12/2013. Chronic LAD inferolateral T wave changes Previous smoker with HTN, DM And HLD. Previous hepatitis C Quit smoking over 40 years ago. 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     No cardiac symptoms   Calcium Score 30.1 isolated to LAD , 46 th percentile done 07/14/22 Discussed starting lipitor at that time    Weight up and needs better diet   ROS: Denies fever, malais, weight loss, blurry vision, decreased visual acuity, cough, sputum, SOB, hemoptysis, pleuritic pain, palpitaitons, heartburn, abdominal pain, melena, lower extremity edema, claudication, or rash.  All other systems reviewed and negative   General: BP 112/74   Pulse 85   Ht 6\' 3"  (1.905 m)   Wt 272 lb (123.4 kg)   SpO2 99%   BMI 34.00 kg/m  Affect appropriate Overweight black male  HEENT: post thyroid  lobectomy  Neck supple with no adenopathy JVP normal no bruits no thyromegaly Lungs clear with no wheezing and good diaphragmatic motion Heart:  S1/S2 SEM murmur, no rub, gallop or click PMI normal Abdomen: benighn, BS positve, no tenderness, no AAA no bruit.  No HSM or HJR Distal pulses intact with no bruits No edema Neuro non-focal Skin warm and dry No muscular weakness  Skin warm and dry No muscular weakness  Medications Current Outpatient Medications  Medication Sig Dispense Refill   amLODipine  (NORVASC ) 10 MG tablet Take 10 mg by mouth daily.     aspirin 81 MG tablet Take 81 mg by mouth daily.     azelastine (OPTIVAR) 0.05 %  ophthalmic solution Place 2 drops into both eyes as needed.     fexofenadine (ALLEGRA) 180 MG tablet Take 180 mg by mouth daily as needed.      glimepiride  (AMARYL ) 2 MG tablet TAKE 1 TABLET BY MOUTH  DAILY BEFORE BREAKFAST 90 tablet 2   hydrochlorothiazide  (HYDRODIURIL ) 25 MG tablet Take 25 mg by mouth daily.     levocetirizine (XYZAL) 5 MG tablet Take 5 mg by mouth at bedtime as needed.  0   magnesium oxide (MAG-OX) 400 MG tablet Take 1 tablet by mouth daily.     MegaRed Omega-3 Krill Oil 500 MG CAPS Take 1 capsule by mouth daily.     metFORMIN  (GLUCOPHAGE -XR) 500 MG 24 hr tablet Take 1,000 mg by mouth 2 (two) times daily.     metoprolol  succinate (TOPROL -XL) 100 MG 24 hr tablet TAKE 1 TABLET BY MOUTH  DAILY BUT IF BP IS GREATER  THAN 140/90 THEN TAKE 2  TABLETS BY MOUTH DAILY 180 tablet 0   montelukast (SINGULAIR) 10 MG tablet Take 10 mg by mouth at bedtime as needed.      Multiple Vitamin (MULTIVITAMIN) tablet Take 1 tablet by mouth daily.     ONE TOUCH ULTRA TEST test strip USE DAILY AS DIRECTED 100 each 4   ONETOUCH DELICA LANCETS 33G MISC Use daily to check sugar as needed.  Dx E11.9 100 each 3   OZEMPIC,  0.25 OR 0.5 MG/DOSE, 2 MG/3ML SOPN Inject into the skin.     spironolactone (ALDACTONE) 25 MG tablet Take 25 mg by mouth once.      valsartan  (DIOVAN ) 320 MG tablet Take 320 mg by mouth daily.     vitamin B-12 (CYANOCOBALAMIN) 500 MCG tablet Take 500 mcg by mouth daily.     No current facility-administered medications for this visit.    Allergies Clonidine  derivatives  Family History: Family History  Problem Relation Age of Onset   Hypertension Mother    Stroke Mother    Cancer Father        stomach CA   Hypertension Father    Stomach cancer Father    Hypertension Sister    Diabetes Sister    Hypertension Brother    Prostate cancer Neg Hx    Colon cancer Neg Hx     Social History: Social History   Socioeconomic History   Marital status: Married    Spouse name:  Louanna Rouse   Number of children: Not on file   Years of education: Not on file   Highest education level: Not on file  Occupational History   Occupation: Retired    Associate Professor: [employer]    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[employer]  Tobacco Use   Smoking status: Former    Current packs/day: 0.00    Average packs/day: 0.3 packs/day for 5.0 years (1.3 ttl pk-yrs)    Types: Cigarettes    Start date: 25    Quit date: 02/10/1978    Years since quitting: 45.3   Smokeless tobacco: Never  Vaping Use   Vaping status: Never Used  Substance and Sexual Activity   Alcohol use: No    Alcohol/week: 0.0 standard drinks of alcohol   Drug use: No   Sexual activity: Not on file  Other Topics Concern   Not on file  Social History Narrative   Married 1978, Louanna Rouse   Grown children - 3 sons 4 grandkids   Orthoptist, 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   He is retired from [employer]   Former smoker, no alcohol no drug use   Social Drivers of Corporate investment banker Strain: Not on [employer] Insecurity: Low Risk  (01/14/2023)   Received from [facility] Vital Sign    Worried About Running Out of Food in the Last Year: Never true    Ran Out of Food in the Last Year: Never true  Transportation Needs: No Transportation Needs (01/14/2023)   Received from [facility]    In the past 12 months, has lack of reliable transportation kept you from medical appointments, meetings, work or from getting things needed for daily living? : No  Physical Activity: Not on file  Stress: Not on file  Social Connections: Not on file  Intimate Partner Violence: Not on file    Past Surgical History:  Procedure Laterality Date   COLONOSCOPY     ESOPHAGOGASTRODUODENOSCOPY     multiple   THYROID  LOBECTOMY  2005   right    Past Medical History:  Diagnosis Date   Allergy    Arthritis    Chronic hepatitis C without mention of hepatic coma    treatment at [facility]    Cirrhosis of liver without mention of alcohol    Diabetes mellitus    type II   Full dentures    Hypertension    OSA (  obstructive sleep apnea) 09/29/2016   Sleep apnea    on CPap   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 Old IMI PR 218 msec   Assessment and Plan  Abnormal ECG chronic no evidence of MI or structural abnormality on echo/ myovue    AV Disease no change in murmur AV sclerosis with mild AR/MR on TTE 04/16/21 observe  HTN: Well controlled.  Continue current medications and low sodium Dash type diet.    DM: Discussed low carb diet.  Target hemoglobin A1c is 6.5 or less.  Continue current medications. Last A1c 7.3 03/19/23   Ortho:  Stable no need for TKR at this time   OSA:  F/u Dr Matilde Son compliant ? Inspiris device referral   Hepatitis C:  F/u Dr Willy Harvest compensated post eradication 2014  US  11/19/20 stable elasticity With minimal scarring ? More steatosis and metabolic syndrome   CAD:  below average calcium score 07/14/22 isolated to LAD Continue ASA LDL was 91 07/15/22  check labs   Lipid/Liver    F/U in a year    Janelle Mediate

## 2023-06-03 ENCOUNTER — Ambulatory Visit: Payer: Medicare Other | Attending: Cardiovascular Disease | Admitting: Cardiovascular Disease

## 2023-06-03 ENCOUNTER — Other Ambulatory Visit: Payer: Self-pay

## 2023-06-03 ENCOUNTER — Ambulatory Visit: Payer: Medicare Other | Admitting: Internal Medicine

## 2023-06-03 ENCOUNTER — Encounter: Payer: Self-pay | Admitting: Cardiovascular Disease

## 2023-06-03 ENCOUNTER — Encounter: Payer: Self-pay | Admitting: Internal Medicine

## 2023-06-03 VITALS — BP 112/74 | HR 85 | Ht 75.0 in | Wt 272.0 lb

## 2023-06-03 VITALS — BP 110/64 | HR 63 | Ht 75.5 in | Wt 272.0 lb

## 2023-06-03 DIAGNOSIS — K760 Fatty (change of) liver, not elsewhere classified: Secondary | ICD-10-CM

## 2023-06-03 DIAGNOSIS — R9431 Abnormal electrocardiogram [ECG] [EKG]: Secondary | ICD-10-CM | POA: Diagnosis not present

## 2023-06-03 DIAGNOSIS — Z1211 Encounter for screening for malignant neoplasm of colon: Secondary | ICD-10-CM

## 2023-06-03 DIAGNOSIS — E782 Mixed hyperlipidemia: Secondary | ICD-10-CM | POA: Diagnosis not present

## 2023-06-03 DIAGNOSIS — I10 Essential (primary) hypertension: Secondary | ICD-10-CM

## 2023-06-03 DIAGNOSIS — B182 Chronic viral hepatitis C: Secondary | ICD-10-CM

## 2023-06-03 MED ORDER — NA SULFATE-K SULFATE-MG SULF 17.5-3.13-1.6 GM/177ML PO SOLN: 1.0000 | Freq: Once | ORAL | 0 refills | Status: AC

## 2023-06-03 NOTE — Progress Notes (Signed)
 Alexander [name] 72 y.o. 1950/09/11 [id number]  Assessment & Plan:   Encounter Diagnoses  Name Primary?   Hepatic steatosis Yes   Chronic hepatitis C without hepatic coma (HCC) treated    Colon cancer screening    I believe his liver has remodeled after hepatitis C treatment and that he does not have cirrhosis. Fib 4 calculation based upon most recent labs is 1.84 Advanced fibrosis excluded  No further liver imaging at this time.  Follow along clinically.  Continue aggressive efforts at losing weight and managing diabetes.    Schedule a screening colonoscopy. The risks and benefits as well as alternatives of endoscopic procedure(s) have been discussed and reviewed. All questions answered. The patient agrees to proceed.  CC: Rodman Clam, DO   Subjective:   Chief Complaint: Follow-up of liver problems  HPI 73 year old man with diabetes, history of hepatitis C (eradicated 2014) who had been followed with compensated cirrhosis and over time we discovered suspected remodeling and resolution of cirrhosis.  There has been a component of hepatic steatosis as well.  He had abdominal ultrasound with hepatic elastography and a K PA 4.8 in 2023.  The K PA was 5.3 in 2022.  He feels well without any abdominal or other gastrointestinal complaints.  He is coming due for a screening colonoscopy with a 10-year anniversary in June.  He is working on exercising and better control of his diabetes.  CBC March 19, 2023 with chronically slightly low hemoglobin at 13.1 and normal platelets.  162.  CMET same date with bilirubin 0.4 alk phos 36 AST 19 ALT 21.  A1c was 7.3%. Wt Readings from Last 3 Encounters:  06/03/23 272 lb (123.4 kg)  06/03/23 272 lb (123.4 kg)  06/09/22 261 lb 9.6 oz (118.7 kg)    Allergies  Allergen Reactions   Clonidine  Derivatives     cramps   Current Meds  Medication Sig   amLODipine  (NORVASC ) 10 MG tablet Take 10 mg by mouth daily.   aspirin 81 MG tablet Take  81 mg by mouth daily.   azelastine (OPTIVAR) 0.05 % ophthalmic solution Place 2 drops into both eyes as needed.   fexofenadine (ALLEGRA) 180 MG tablet Take 180 mg by mouth daily as needed.    glimepiride  (AMARYL ) 2 MG tablet TAKE 1 TABLET BY MOUTH  DAILY BEFORE BREAKFAST   hydrochlorothiazide  (HYDRODIURIL ) 25 MG tablet Take 25 mg by mouth daily.   levocetirizine (XYZAL) 5 MG tablet Take 5 mg by mouth at bedtime as needed.   magnesium oxide (MAG-OX) 400 MG tablet Take 1 tablet by mouth daily.   MegaRed Omega-3 Krill Oil 500 MG CAPS Take 1 capsule by mouth daily.   metFORMIN  (GLUCOPHAGE -XR) 500 MG 24 hr tablet Take 1,000 mg by mouth 2 (two) times daily.   metoprolol  succinate (TOPROL -XL) 100 MG 24 hr tablet TAKE 1 TABLET BY MOUTH  DAILY BUT IF BP IS GREATER  THAN 140/90 THEN TAKE 2  TABLETS BY MOUTH DAILY   montelukast (SINGULAIR) 10 MG tablet Take 10 mg by mouth at bedtime as needed.    Multiple Vitamin (MULTIVITAMIN) tablet Take 1 tablet by mouth daily.   ONE TOUCH ULTRA TEST test strip USE DAILY AS DIRECTED   ONETOUCH DELICA LANCETS 33G MISC Use daily to check sugar as needed.  Dx E11.9   OZEMPIC, 0.25 OR 0.5 MG/DOSE, 2 MG/3ML SOPN Inject into the skin.   spironolactone (ALDACTONE) 25 MG tablet Take 25 mg by mouth once.  valsartan  (DIOVAN ) 320 MG tablet Take 320 mg by mouth daily.   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   Review of Systems As per HPI  Objective:   Physical Exam @BP  110/64   Pulse 63   Ht 6' 3.5" (1.918 m)   Wt 272 lb (123.4 kg)   BMI 33.55 kg/m @  General:  NAD Eyes:   anicteric Lungs:  clear Heart::  S1S2 no rubs, murmurs or gallops Abdomen:  soft and nontender, BS+, obese, medium diastasis recti epigastrium     Data Reviewed:  See HPI

## 2023-06-03 NOTE — Patient Instructions (Addendum)
 Medication Instructions:  Your physician recommends that you continue on your current medications as directed. Please refer to the Current Medication list given to you today.  *If you need a refill on your cardiac medications before your next appointment, please call your pharmacy*  Lab Work: Your physician recommends that you have lab work today at [facility]. Fasting lipid and liver panel.  If you have labs (blood work) drawn today and your tests are completely normal, you will receive your results only by: MyChart Message (if you have MyChart) OR A paper copy in the mail If you have any lab test that is abnormal or we need to change your treatment, we will call you to review the results.  Testing/Procedures: None ordered today.  Follow-Up: At [facility], you and your health needs are our priority.  As part of our continuing mission to provide you with exceptional heart care, our providers are all part of one team.  This team includes your primary Cardiologist (physician) and Advanced Practice Providers or APPs (Physician Assistants and Nurse Practitioners) who all work together to provide you with the care you need, when you need it.  Your next appointment:   1 year(s)  Provider:   Janelle Mediate, MD     We recommend signing up for the patient portal called "MyChart".  Sign up information is provided on this After Visit Summary.  MyChart is used to connect with patients for Virtual Visits (Telemedicine).  Patients are able to view lab/test results, encounter notes, upcoming appointments, etc.  Non-urgent messages can be sent to your provider as well.   To learn more about what you can do with MyChart, go to [email or web address].   Other Instructions       1st Floor: - Lobby - Registration  - Pharmacy  - Lab - Cafe  2nd Floor: - PV Lab - Diagnostic Testing (echo, CT, nuclear med)  3rd Floor: - Vacant  4th Floor: - TCTS (cardiothoracic surgery) - AFib  Clinic - Structural Heart Clinic - Vascular Surgery  - Vascular Ultrasound  5th Floor: - HeartCare Cardiology (general and EP) - Clinical Pharmacy for coumadin, hypertension, lipid, weight-loss medications, and med management appointments    Valet parking services will be available as well.

## 2023-06-03 NOTE — Patient Instructions (Signed)
 You have been scheduled for a colonoscopy. Please follow written instructions given to you at your visit today.   If you use inhalers (even only as needed), please bring them with you on the day of your procedure.  DO NOT TAKE 7 DAYS PRIOR TO TEST- Trulicity (dulaglutide) Ozempic, Wegovy (semaglutide) Mounjaro (tirzepatide) Bydureon Bcise (exanatide extended release)  DO NOT TAKE 1 DAY PRIOR TO YOUR TEST Rybelsus (semaglutide) Adlyxin (lixisenatide) Victoza (liraglutide) Byetta (exanatide) ___________________________________________________________________________   I appreciate the opportunity to care for you. Loy Ruff, MD, [facility]

## 2023-06-04 LAB — LIPID PANEL
Chol/HDL Ratio: 4.3 ratio (ref 0.0–5.0)
Cholesterol, Total: 159 mg/dL (ref 100–199)
HDL: 37 mg/dL — ABNORMAL LOW (ref >39)
LDL Chol Calc (NIH): 102 mg/dL — ABNORMAL HIGH (ref 0–99)
Triglycerides: 109 mg/dL (ref 0–149)
VLDL Cholesterol Cal: 20 mg/dL (ref 5–40)

## 2023-06-04 LAB — HEPATIC FUNCTION PANEL
ALT: 27 IU/L (ref 0–44)
AST: 22 IU/L (ref 0–40)
Albumin: 4.5 g/dL (ref 3.8–4.8)
Alkaline Phosphatase: 42 IU/L — ABNORMAL LOW (ref 44–121)
Bilirubin Total: 0.4 mg/dL (ref 0.0–1.2)
Bilirubin, Direct: 0.13 mg/dL (ref 0.00–0.40)
Total Protein: 7.3 g/dL (ref 6.0–8.5)

## 2023-06-08 ENCOUNTER — Telehealth: Payer: Self-pay

## 2023-06-08 DIAGNOSIS — I10 Essential (primary) hypertension: Secondary | ICD-10-CM

## 2023-06-08 DIAGNOSIS — E782 Mixed hyperlipidemia: Secondary | ICD-10-CM

## 2023-06-08 DIAGNOSIS — R9431 Abnormal electrocardiogram [ECG] [EKG]: Secondary | ICD-10-CM

## 2023-06-08 MED ORDER — ATORVASTATIN CALCIUM 10 MG PO TABS: 10.0000 mg | ORAL_TABLET | Freq: Every day | ORAL | 11 refills | Status: AC

## 2023-06-08 NOTE — Telephone Encounter (Signed)
-----   Message from Janelle Mediate sent at 06/08/2023 10:31 AM EDT ----- His Hep C is old and no cirrhosis by recent office visit and normal AST/ALT will defer to Dr Willy Harvest if statin is ok ----- Message ----- From: Antonetta Kitchen, RN Sent: 06/08/2023   9:42 AM EDT To: [name], MD  The patient has been notified of the result and verbalized understanding.  All questions (if any) were answered. Antonetta Kitchen, RN 06/08/2023 9:41 AM   Patient is concerned about taking Lipitor due to his chronic liver disease. Will send message back to Dr. Nishan for advisement.

## 2023-06-08 NOTE — Telephone Encounter (Signed)
 The patient has been notified of the result and verbalized understanding.  All questions (if any) were answered. Antonetta Kitchen, RN 06/08/2023 3:13 PM   Placed orders.

## 2023-07-27 ENCOUNTER — Encounter: Admitting: Internal Medicine

## 2023-08-17 ENCOUNTER — Encounter: Payer: Self-pay | Admitting: Internal Medicine

## 2023-08-24 ENCOUNTER — Encounter: Admitting: Internal Medicine

## 2023-10-05 ENCOUNTER — Telehealth: Payer: Self-pay | Admitting: Internal Medicine

## 2023-10-05 NOTE — Telephone Encounter (Signed)
 Patient needing updated prep instructions as well as f/u call to discuss prep. Please advise.   Thank you

## 2023-10-05 NOTE — Telephone Encounter (Signed)
 I called and spoke to both Alexander [name] and his wife Alexander [name] about his upcoming colon on 10/08/2023. Questions answered. I redid the instructions and sent them to his email: [email or web address]. He has his prep kit.

## 2023-10-07 NOTE — Progress Notes (Unsigned)
 [facility] History and Physical   Primary Care Physician:  Juliene Asberry NOVAK, DO   Reason for Procedure:  Colon cancer screening  Plan:    Colonoscopy     HPI: Alexander [name] is a 73 y.o. male presenting for a repeat screening colonoscopy exam.  2015 exam without neoplasia.   Past Medical History:  Diagnosis Date   Allergy    Arthritis    Chronic hepatitis C without mention of hepatic coma    treatment at [facility] 2014   Cirrhosis of liver without mention of alcohol    Diabetes mellitus    type II   Full dentures    Hypertension    OSA (obstructive sleep apnea) 09/29/2016   Sleep apnea    on CPap   Tuberculin test reaction    Wears glasses     Past Surgical History:  Procedure Laterality Date   COLONOSCOPY     ESOPHAGOGASTRODUODENOSCOPY     multiple   THYROID  LOBECTOMY  2005   right     Current Outpatient Medications  Medication Sig Dispense Refill   amoxicillin-clavulanate (AUGMENTIN) 875-125 MG tablet Take 1 tablet by mouth 2 (two) times daily.     benzonatate  (TESSALON ) 100 MG capsule Take by mouth.     Blood Glucose Monitoring Suppl (ACCU-CHEK GUIDE) w/Device KIT SMARTSIG:device     fluticasone (FLONASE) 50 MCG/ACT nasal spray Place 1 spray into the nose.     indomethacin (INDOCIN) 50 MG capsule SMARTSIG:1 Capsule(s) By Mouth 1-3 Times Daily     Na Sulfate-K Sulfate-Mg Sulfate concentrate (SUPREP) 17.5-3.13-1.6 GM/177ML SOLN as directed.     OZEMPIC, 1 MG/DOSE, 4 MG/3ML SOPN Inject 1 mg into the skin.     amLODipine  (NORVASC ) 10 MG tablet Take 10 mg by mouth daily.     aspirin 81 MG tablet Take 81 mg by mouth daily.     atorvastatin  (LIPITOR) 10 MG tablet Take 1 tablet (10 mg total) by mouth daily. 30 tablet 11   azelastine (OPTIVAR) 0.05 % ophthalmic solution Place 2 drops into both eyes as needed.     fexofenadine (ALLEGRA) 180 MG tablet Take 180 mg by mouth daily as needed.      glimepiride  (AMARYL ) 2 MG tablet TAKE 1 TABLET BY MOUTH  DAILY  BEFORE BREAKFAST 90 tablet 2   hydrochlorothiazide  (HYDRODIURIL ) 25 MG tablet Take 25 mg by mouth daily.     levocetirizine (XYZAL) 5 MG tablet Take 5 mg by mouth at bedtime as needed.  0   magnesium oxide (MAG-OX) 400 MG tablet Take 1 tablet by mouth daily.     MegaRed Omega-3 Krill Oil 500 MG CAPS Take 1 capsule by mouth daily.     metFORMIN  (GLUCOPHAGE -XR) 500 MG 24 hr tablet Take 1,000 mg by mouth 2 (two) times daily.     metoprolol  succinate (TOPROL -XL) 100 MG 24 hr tablet TAKE 1 TABLET BY MOUTH  DAILY BUT IF BP IS GREATER  THAN 140/90 THEN TAKE 2  TABLETS BY MOUTH DAILY 180 tablet 0   montelukast (SINGULAIR) 10 MG tablet Take 10 mg by mouth at bedtime as needed.      Multiple Vitamin (MULTIVITAMIN) tablet Take 1 tablet by mouth daily.     ONE TOUCH ULTRA TEST test strip USE DAILY AS DIRECTED 100 each 4   ONETOUCH DELICA LANCETS 33G MISC Use daily to check sugar as needed.  Dx E11.9 100 each 3   OZEMPIC, 0.25 OR 0.5 MG/DOSE, 2 MG/3ML SOPN Inject into the  skin.     spironolactone (ALDACTONE) 25 MG tablet Take 25 mg by mouth once.      valsartan  (DIOVAN ) 320 MG tablet Take 320 mg by mouth daily.     No current facility-administered medications for this visit.    Allergies as of 10/08/2023 - Review Complete 06/03/2023  Allergen Reaction Noted   Clonidine  derivatives  10/16/2011    Family History  Problem Relation Age of Onset   Hypertension Mother    Stroke Mother    Cancer Father        stomach CA   Hypertension Father    Stomach cancer Father    Hypertension Sister    Diabetes Sister    Hypertension Brother    Prostate cancer Neg Hx    Colon cancer Neg Hx     Social History   Socioeconomic History   Marital status: Married    Spouse name: Dorothe   Number of children: Not on file   Years of education: Not on file   Highest education level: Not on file  Occupational History   Occupation: Retired    Associate Professor: [employer]    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[employer]   Tobacco Use   Smoking status: Former    Current packs/day: 0.00    Average packs/day: 0.3 packs/day for 5.0 years (1.3 ttl pk-yrs)    Types: Cigarettes    Start date: 53    Quit date: 02/10/1978    Years since quitting: 45.6   Smokeless tobacco: Never  Vaping Use   Vaping status: Never Used  Substance and Sexual Activity   Alcohol use: No    Alcohol/week: 0.0 standard drinks of alcohol   Drug use: No   Sexual activity: Not on file  Other Topics Concern   Not on file  Social History Narrative   Married 1978, Dorothe   Grown children - 3 sons 4 grandkids   Orthoptist, 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   He is retired from [employer]   Former smoker, no alcohol no drug use   Social Drivers of Corporate investment banker Strain: Not on [employer] Insecurity: Low Risk  (01/14/2023)   Received from [facility]   Hunger Vital Sign    Within the past 12 months, you worried that your food would run out before you got money to buy more: Never true    Within the past 12 months, the food you bought just didn't last and you didn't have money to get more. : Never true  Transportation Needs: No Transportation Needs (01/14/2023)   Received from [facility]    In the past 12 months, has lack of reliable transportation kept you from medical appointments, meetings, work or from getting things needed for daily living? : No  Physical Activity: Not on file  Stress: Not on file  Social Connections: Not on file  Intimate Partner Violence: Not on file    Review of Systems: Positive for *** All other review of systems negative except as mentioned in the HPI.  Physical Exam: Vital signs There were no vitals taken for this visit.  General:   Alert,  Well-developed, well-nourished, pleasant and cooperative in NAD Lungs:  Clear throughout to auscultation.   Heart:  Regular rate and rhythm; no murmurs, clicks, rubs,  or gallops. Abdomen:  Soft, nontender and  nondistended. Normal bowel sounds.   Neuro/Psych:  Alert and cooperative. Normal mood and  affect. A and O x 3   @Feiga Nadel  CHARLENA Commander, MD, [facility] [phone] (pager) 10/07/2023 8:54 PM@

## 2023-10-08 ENCOUNTER — Encounter: Payer: Self-pay | Admitting: Internal Medicine

## 2023-10-08 ENCOUNTER — Ambulatory Visit (AMBULATORY_SURGERY_CENTER): Admitting: Internal Medicine

## 2023-10-08 VITALS — BP 96/65 | HR 67 | Temp 97.5°F | Resp 19 | Ht 75.0 in | Wt 272.0 lb

## 2023-10-08 DIAGNOSIS — Z1211 Encounter for screening for malignant neoplasm of colon: Secondary | ICD-10-CM

## 2023-10-08 DIAGNOSIS — K562 Volvulus: Secondary | ICD-10-CM | POA: Diagnosis not present

## 2023-10-08 MED ORDER — SODIUM CHLORIDE 0.9 % IV SOLN: 500.0000 mL | Freq: Once | INTRAVENOUS | Status: DC

## 2023-10-08 NOTE — Op Note (Signed)
 [facility] Patient Name: Alexander [name] Procedure Date: 10/08/2023 7:56 AM MRN: [id number] Endoscopist: Lupita FORBES Commander , MD, [id number] Age: 72 Referring MD:  Date of Birth: 12-28-1950 Gender: Male Account #: [id number] Procedure:                Colonoscopy Indications:              Screening for colorectal malignant neoplasm, Last                            colonoscopy: 2015 Medicines:                Monitored Anesthesia Care Procedure:                Pre-Anesthesia Assessment:                           - Prior to the procedure, a History and Physical                            was performed, and patient medications and                            allergies were reviewed. The patient's tolerance of                            previous anesthesia was also reviewed. The risks                            and benefits of the procedure and the sedation                            options and risks were discussed with the patient.                            All questions were answered, and informed consent                            was obtained. Prior Anticoagulants: The patient has                            taken no anticoagulant or antiplatelet agents. ASA                            Grade Assessment: III - A patient with severe                            systemic disease. After reviewing the risks and                            benefits, the patient was deemed in satisfactory                            condition to undergo the procedure.  After obtaining informed consent, the colonoscope                            was passed under direct vision. Throughout the                            procedure, the patient's blood pressure, pulse, and                            oxygen saturations were monitored continuously. The                            Olympus Scope [id number] was introduced through the                            anus and advanced to the the cecum,  identified by                            appendiceal orifice and ileocecal valve. The                            colonoscopy was somewhat difficult due to                            significant looping. Successful completion of the                            procedure was aided by applying abdominal pressure.                            The patient tolerated the procedure well. The                            quality of the bowel preparation was good. The                            ileocecal valve, appendiceal orifice, and rectum                            were photographed. The bowel preparation used was                            SUPREP via split dose instruction. Scope In: 8:04:49 AM Scope Out: 8:26:01 AM Scope Withdrawal Time: 0 hours 9 minutes 34 seconds  Total Procedure Duration: 0 hours 21 minutes 12 seconds  Findings:                 The perianal and digital rectal examinations were                            normal.                           The entire examined colon appeared normal on direct  and retroflexion views. Complications:            No immediate complications. Estimated Blood Loss:     Estimated blood loss: none. Impression:               - The entire examined colon is normal on direct and                            retroflexion views.                           - No specimens collected. Recommendation:           - Patient has a contact number available for                            emergencies. The signs and symptoms of potential                            delayed complications were discussed with the                            patient. Return to normal activities tomorrow.                            Written discharge instructions were provided to the                            patient.                           - Resume previous diet.                           - Continue present medications.                           - No repeat colonoscopy  due to age and the absence                            of colonic polyps. Lupita FORBES Commander, MD 10/08/2023 8:34:40 AM This report has been signed electronically.

## 2023-10-08 NOTE — Patient Instructions (Addendum)
 Resume previous diet. Continue present medications.  No repeat colonoscopy due to age and the absence of colonic polyps.  YOU HAD AN ENDOSCOPIC PROCEDURE TODAY AT THE [facility]:   Refer to the procedure report that was given to you for any specific questions about what was found during the examination.  If the procedure report does not answer your questions, please call your gastroenterologist to clarify.  If you requested that your care partner not be given the details of your procedure findings, then the procedure report has been included in a sealed envelope for you to review at your convenience later.  YOU SHOULD EXPECT: Some feelings of bloating in the abdomen. Passage of more gas than usual.  Walking can help get rid of the air that was put into your GI tract during the procedure and reduce the bloating. If you had a lower endoscopy (such as a colonoscopy or flexible sigmoidoscopy) you may notice spotting of blood in your stool or on the toilet paper. If you underwent a bowel prep for your procedure, you may not have a normal bowel movement for a few days.  Please Note:  You might notice some irritation and congestion in your nose or some drainage.  This is from the oxygen used during your procedure.  There is no need for concern and it should clear up in a day or so.  SYMPTOMS TO REPORT IMMEDIATELY:  Following lower endoscopy (colonoscopy or flexible sigmoidoscopy):  Excessive amounts of blood in the stool  Significant tenderness or worsening of abdominal pains  Swelling of the abdomen that is new, acute  Fever of 100F or higher  For urgent or emergent issues, a gastroenterologist can be reached at any hour by calling [phone]. Do not use MyChart messaging for urgent concerns.    DIET:  We do recommend a small meal at first, but then you may proceed to your regular diet.  Drink plenty of fluids but you should avoid alcoholic beverages for 24 hours.  ACTIVITY:  You  should plan to take it easy for the rest of today and you should NOT DRIVE or use heavy machinery until tomorrow (because of the sedation medicines used during the test).    FOLLOW UP: Our staff will call the number listed on your records the next business day following your procedure.  We will call around 7:15- 8:00 am to check on you and address any questions or concerns that you may have regarding the information given to you following your procedure. If we do not reach you, we will leave a message.     If any biopsies were taken you will be contacted by phone or by letter within the next 1-3 weeks.  Please call us  at [phone] if you have not heard about the biopsies in 3 weeks.    SIGNATURES/CONFIDENTIALITY: You and/or your care partner have signed paperwork which will be entered into your electronic medical record.  These signatures attest to the fact that that the information above on your After Visit Summary has been reviewed and is understood.  Full responsibility of the confidentiality of this discharge information lies with you and/or your care-partner.Colonoscopy was normal.  There were no polyps or cancer seen.  I do not think you need any further routine repeat colonoscopy screening exams.    I appreciate the opportunity to care for you. Lupita CHARLENA Commander, MD, [facility]

## 2023-10-08 NOTE — Progress Notes (Signed)
 Sedate, gd SR, tolerated procedure well, VSS, report to RN

## 2023-10-09 ENCOUNTER — Telehealth: Payer: Self-pay

## 2023-10-09 NOTE — Telephone Encounter (Signed)
  Follow up Call-     10/08/2023    7:19 AM  Call back number  Post procedure Call Back phone  # [phone]  Permission to leave phone message Yes     Patient questions:  Do you have a fever, pain , or abdominal swelling? No. Pain Score  0 *  Have you tolerated food without any problems? Yes.    Have you been able to return to your normal activities? Yes.    Do you have any questions about your discharge instructions: Diet   No. Medications  No. Follow up visit  No.  Do you have questions or concerns about your Care? No.  Actions: * If pain score is 4 or above: No action needed, pain <4.

## 2024-06-02 ENCOUNTER — Ambulatory Visit: Admitting: Cardiovascular Disease
